# Patient Record
Sex: Male | Born: 1990 | Race: White | Hispanic: No | Marital: Single | Smoking: Never smoker
Health system: Southern US, Community
[De-identification: ages and names within clinical notes are randomized; demographics above are authoritative.]

## PROBLEM LIST (undated history)

## (undated) DIAGNOSIS — U071 COVID-19: Secondary | ICD-10-CM

## (undated) HISTORY — PX: EYE SURGERY: SHX253

---
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
## 2013-07-21 ENCOUNTER — Ambulatory Visit
Admission: RE | Admit: 2013-07-21 | Discharge: 2013-07-21 | Disposition: A | Discharge status: Home / Self Care | Payer: PRIVATE HEALTH INSURANCE | Source: Ambulatory Visit | Attending: Orthopedic Surgery | Admitting: Orthopedic Surgery

## 2013-07-21 ENCOUNTER — Other Ambulatory Visit: Payer: Self-pay | Admitting: Orthopedic Surgery

## 2013-07-21 DIAGNOSIS — M25562 Pain in left knee: Secondary | ICD-10-CM

## 2013-10-03 DIAGNOSIS — S82002D Unspecified fracture of left patella, subsequent encounter for closed fracture with routine healing: Secondary | ICD-10-CM | POA: Insufficient documentation

## 2014-07-31 DIAGNOSIS — R441 Visual hallucinations: Secondary | ICD-10-CM | POA: Insufficient documentation

## 2014-07-31 DIAGNOSIS — R440 Auditory hallucinations: Secondary | ICD-10-CM | POA: Insufficient documentation

## 2015-08-02 ENCOUNTER — Ambulatory Visit (INDEPENDENT_AMBULATORY_CARE_PROVIDER_SITE_OTHER): Payer: PRIVATE HEALTH INSURANCE

## 2015-08-02 ENCOUNTER — Ambulatory Visit (INDEPENDENT_AMBULATORY_CARE_PROVIDER_SITE_OTHER): Payer: PRIVATE HEALTH INSURANCE | Admitting: Family Medicine

## 2015-08-02 ENCOUNTER — Encounter: Payer: Self-pay | Admitting: Family Medicine

## 2015-08-02 VITALS — BP 126/81 | HR 88 | Temp 98.3°F | Wt 189.0 lb

## 2015-08-02 DIAGNOSIS — R0989 Other specified symptoms and signs involving the circulatory and respiratory systems: Secondary | ICD-10-CM | POA: Diagnosis not present

## 2015-08-02 DIAGNOSIS — R05 Cough: Secondary | ICD-10-CM

## 2015-08-02 DIAGNOSIS — R053 Chronic cough: Secondary | ICD-10-CM

## 2015-08-02 MED ORDER — GUAIFENESIN-CODEINE 100-10 MG/5ML PO SOLN: 5.0000 mL | Freq: Every evening | ORAL | Status: DC | PRN

## 2015-08-02 MED ORDER — BENZONATATE 200 MG PO CAPS: 200.0000 mg | ORAL_CAPSULE | Freq: Three times a day (TID) | ORAL | Status: DC | PRN

## 2015-08-02 MED ORDER — FLUTICASONE PROPIONATE 50 MCG/ACT NA SUSP: 2.0000 | Freq: Every day | NASAL | Status: DC

## 2015-08-02 MED ORDER — OMEPRAZOLE 40 MG PO CPDR: 40.0000 mg | DELAYED_RELEASE_CAPSULE | Freq: Every day | ORAL | Status: DC

## 2015-08-02 MED ORDER — ALBUTEROL SULFATE 108 (90 BASE) MCG/ACT IN AEPB: 1.0000 | INHALATION_SPRAY | RESPIRATORY_TRACT | Status: DC | PRN

## 2015-08-02 NOTE — Patient Instructions (Signed)
Thank you for coming in today. Use the albuterol inhaler as needed.  Take the tessalon cough pills during the day and use the codeine cough medicine at night as needed.  Get xray today.  Take omeprazole for possible acid reflux.  Take over the counter zyrtec (certizine) daily.  Use the nasal spray.   Call or go to the emergency room if you get worse, have trouble breathing, have chest pains, or palpitations.   Cough, Adult Coughing is a reflex that clears your throat and your airways. Coughing helps to heal and protect your lungs. It is normal to cough occasionally, but a cough that happens with other symptoms or lasts a long time may be a sign of a condition that needs treatment. A cough may last only 2-3 weeks (acute), or it may last longer than 8 weeks (chronic). CAUSES Coughing is commonly caused by:  Breathing in substances that irritate your lungs.  A viral or bacterial respiratory infection.  Allergies.  Asthma.  Postnasal drip.  Smoking.  Acid backing up from the stomach into the esophagus (gastroesophageal reflux).  Certain medicines.  Chronic lung problems, including COPD (or rarely, lung cancer).  Other medical conditions such as heart failure. HOME CARE INSTRUCTIONS  Pay attention to any changes in your symptoms. Take these actions to help with your discomfort:  Take medicines only as told by your health care provider.  If you were prescribed an antibiotic medicine, take it as told by your health care provider. Do not stop taking the antibiotic even if you start to feel better.  Talk with your health care provider before you take a cough suppressant medicine.  Drink enough fluid to keep your urine clear or pale yellow.  If the air is dry, use a cold steam vaporizer or humidifier in your bedroom or your home to help loosen secretions.  Avoid anything that causes you to cough at work or at home.  If your cough is worse at night, try sleeping in a  semi-upright position.  Avoid cigarette smoke. If you smoke, quit smoking. If you need help quitting, ask your health care provider.  Avoid caffeine.  Avoid alcohol.  Rest as needed. SEEK MEDICAL CARE IF:   You have new symptoms.  You cough up pus.  Your cough does not get better after 2-3 weeks, or your cough gets worse.  You cannot control your cough with suppressant medicines and you are losing sleep.  You develop pain that is getting worse or pain that is not controlled with pain medicines.  You have a fever.  You have unexplained weight loss.  You have night sweats. SEEK IMMEDIATE MEDICAL CARE IF:  You cough up blood.  You have difficulty breathing.  Your heartbeat is very fast.   This information is not intended to replace advice given to you by your health care provider. Make sure you discuss any questions you have with your health care provider.   Document Released: 10/17/2010 Document Revised: 01/09/2015 Document Reviewed: 06/27/2014 Elsevier Interactive Patient Education [employer]2016 Elsevier Inc.

## 2015-08-02 NOTE — Progress Notes (Signed)
       Christopher Zamora is a 25 y.o. male who [facility] today for establish care and discuss cough congestion and runny nose. Patient has headache and no lean nonproductive cough now for about a month. He notes over the last week has worsened. In the last week he has developed runny nose sneezing itchy watery eyes and some congestion. The cough is quite bothersome and interferes with sleep. He's tried Mucinex which has helped a little. He denies any significant wheezing or shortness of breath. No fevers chills vomiting or diarrhea.   History reviewed. No pertinent past medical history. Past Surgical History  Procedure Laterality Date  . Eye surgery     Social History  Substance Use Topics  . Smoking status: Never Smoker   . Smokeless tobacco: Not on file  . Alcohol Use: No   family history is not on file.  ROS as above: No headache, visual changes, nausea, vomiting, diarrhea, constipation, dizziness, abdominal pain, skin rash, fevers, chills, night sweats, weight loss, swollen lymph nodes, body aches, joint swelling, muscle aches, chest pain, shortness of breath, mood changes, visual or auditory hallucinations.   Medications: Current Outpatient Prescriptions  Medication Sig Dispense Refill  . Albuterol Sulfate (PROAIR RESPICLICK) 108 (90 Base) MCG/ACT AEPB Inhale 1-2 puffs into the lungs every 4 (four) hours as needed. 1 each 1  . benzonatate (TESSALON) 200 MG capsule Take 1 capsule (200 mg total) by mouth 3 (three) times daily as needed for cough. 45 capsule 3  . fluticasone (FLONASE) 50 MCG/ACT nasal spray Place 2 sprays into both nostrils daily. 16 g 2  . guaiFENesin-codeine 100-10 MG/5ML syrup Take 5 mLs by mouth at bedtime as needed for cough. 180 mL 0  . omeprazole (PRILOSEC) 40 MG capsule Take 1 capsule (40 mg total) by mouth daily. 30 capsule 3   No current  facility-administered medications for this visit.   Allergies  Allergen Reactions  . Pentobarbital Other (See Comments)    Violent behavior   . Erythromycin Rash     Exam:  BP 126/81 mmHg  Pulse 88  Temp(Src) 98.3 F (36.8 C) (Oral)  Wt 189 lb (85.73 kg)  SpO2 97% Gen: Well NAD Nontoxic appearing HEENT: EOMI,  MMM posterior pharynx with cobblestoning. Normal tympanic membranes left. Right is partially occluded by cerumen. No cervical lymphadenopathy. Lungs: Normal work of breathing. CTABL Heart: RRR no MRG Abd: NABS, Soft. Nondistended, Nontender Exts: Brisk capillary refill, warm and well perfused.   No results found for this or any previous visit (from the past 24 hour(s)). No results found.   25 year old male with chronic cough. Likely due to postnasal drainage due to seasonal allergies. Treat with codeine and Tessalon for cough suppression and treat with Zyrtec and Flonase. Additionally we'll provide albuterol in case this is a cough variant asthma and omeprazole in case this is reflux related. Additionally we'll obtain a chest x-ray..  Is not any improvement would consider treatment with prednisone and azithromycin for presumed bronchitis.

## 2015-08-05 ENCOUNTER — Telehealth: Payer: Self-pay

## 2015-08-05 MED ORDER — DOXYCYCLINE HYCLATE 100 MG PO TABS: 100.0000 mg | ORAL_TABLET | Freq: Two times a day (BID) | ORAL | Status: DC

## 2015-08-05 MED ORDER — PREDNISONE 10 MG PO TABS: 30.0000 mg | ORAL_TABLET | Freq: Every day | ORAL | Status: DC

## 2015-08-05 NOTE — Telephone Encounter (Signed)
Christopher Zamora complains of congestion and pain in the chest when he coughs. He wanted to know if he needs an antibiotic. Denies fever, chills or sweats.

## 2015-08-05 NOTE — Progress Notes (Signed)
Quick Note:  Xray shows bronchitis ______ 

## 2015-08-05 NOTE — Telephone Encounter (Signed)
Patient advised of recommendations.  

## 2015-08-05 NOTE — Telephone Encounter (Signed)
I sent in both Doxycycline antibiotic and prednisone.

## 2016-02-06 ENCOUNTER — Encounter: Payer: Self-pay | Admitting: *Deleted

## 2016-02-06 ENCOUNTER — Emergency Department (INDEPENDENT_AMBULATORY_CARE_PROVIDER_SITE_OTHER)
Admission: EM | Admit: 2016-02-06 | Discharge: 2016-02-06 | Disposition: A | Discharge status: Home / Self Care | Payer: PRIVATE HEALTH INSURANCE | Source: Home / Self Care | Attending: Family Medicine | Admitting: Family Medicine

## 2016-02-06 DIAGNOSIS — B9789 Other viral agents as the cause of diseases classified elsewhere: Secondary | ICD-10-CM

## 2016-02-06 DIAGNOSIS — J069 Acute upper respiratory infection, unspecified: Secondary | ICD-10-CM

## 2016-02-06 LAB — POCT RAPID STREP A (OFFICE): Rapid Strep A Screen: NEGATIVE

## 2016-02-06 MED ORDER — AMOXICILLIN 875 MG PO TABS: 875.0000 mg | ORAL_TABLET | Freq: Two times a day (BID) | ORAL | 0 refills | Status: DC

## 2016-02-06 MED ORDER — BENZONATATE 200 MG PO CAPS: 200.0000 mg | ORAL_CAPSULE | Freq: Every day | ORAL | 0 refills | Status: DC

## 2016-02-06 NOTE — ED Provider Notes (Signed)
[facility]    CSN: [id number] Arrival date & time: 02/06/16  1125     History   Chief Complaint Chief Complaint  Patient presents with  . Sore Throat  . Generalized Body Aches    in upper back    HPI Christopher Zamora is a 25 y.o. male.   Yesterday patient developed typical cold-like symptoms  including mild sore throat, sinus congestion, headache, fatigue, and cough.    The history is provided by the patient.    History reviewed. No pertinent past medical history.  There are no active problems to display for this patient.   Past Surgical History:  Procedure Laterality Date  . EYE SURGERY         Home Medications    Prior to Admission medications   Medication Sig Start Date End Date Taking? Authorizing Provider  Albuterol Sulfate (PROAIR RESPICLICK) 108 (90 Base) MCG/ACT AEPB Inhale 1-2 puffs into the lungs every 4 (four) hours as needed. 08/02/15   Rodolph BongEvan S Corey, MD  amoxicillin (AMOXIL) 875 MG tablet Take 1 tablet (875 mg total) by mouth 2 (two) times daily. (Rx void after 02/14/16) 02/06/16   Lattie HawStephen A Yuvaan Olander, MD  benzonatate (TESSALON) 200 MG capsule Take 1 capsule (200 mg total) by mouth at bedtime. Take as needed for cough 02/06/16   Lattie HawStephen A Hadlei Stitt, MD  fluticasone [facility](FLONASE) 50 MCG/ACT nasal spray Place 2 sprays into both nostrils daily. 08/02/15   Rodolph BongEvan S Corey, MD  omeprazole (PRILOSEC) 40 MG capsule Take 1 capsule (40 mg total) by mouth daily. 08/02/15   Rodolph BongEvan S Corey, MD    Family History History reviewed. No pertinent family history.  Social History Social History  Substance Use Topics  . Smoking status: Never Smoker  . Smokeless tobacco: Never Used  . Alcohol use No     Allergies   Pentobarbital and Erythromycin   Review of Systems Review of Systems + sore throat + cough No pleuritic pain No wheezing + nasal congestion + post-nasal drainage No sinus pain/pressure No itchy/red eyes No earache No hemoptysis No SOB No  fever/chills No nausea No vomiting No abdominal pain No diarrhea No urinary symptoms No skin rash + fatigue + myalgias + headache Used OTC meds without relief   Physical Exam Triage Vital Signs ED Triage Vitals [02/06/16 1145]  Enc Vitals Group     BP 118/76     Pulse Rate 83     Resp 14     Temp 98.2 F (36.8 C)     Temp Source Oral     SpO2 98 %     Weight 196 lb (88.9 kg)     Height 6\' 4"  (1.93 m)     Head Circumference      Peak Flow      Pain Score      Pain Loc      Pain Edu?      Excl. in GC?    No data found.   Updated Vital Signs BP 118/76 (BP Location: Left Arm)   Pulse 83   Temp 98.2 F (36.8 C) (Oral)   Resp 14   Ht 6\' 4"  (1.93 m)   Wt 196 lb (88.9 kg)   SpO2 98%   BMI 23.86 kg/m   Visual Acuity Right Eye Distance:   Left Eye Distance:   Bilateral Distance:    Right Eye Near:   Left Eye Near:    Bilateral Near:     Physical Exam  Nursing notes and Vital Signs reviewed. Appearance:  Patient appears stated age, and in no acute distress Eyes:  Pupils are equal, round, and reactive to light and accomodation.  Extraocular movement is intact.  Conjunctivae are not inflamed  Ears:  Right canal occluded with cerumen.  Left canal and tympanic membrane normal.  Nose:  Mildly congested turbinates.  No sinus tenderness.    Pharynx:  Normal Neck:  Supple.  Tender enlarged posterior/lateral nodes are palpated bilaterally  Lungs:  Clear to auscultation.  Breath sounds are equal.  Moving air well. Chest:  Distinct tenderness to palpation over the mid-sternum.  Heart:  Regular rate and rhythm without murmurs, rubs, or gallops.  Abdomen:  Nontender without masses or hepatosplenomegaly.  Bowel sounds are present.  No CVA or flank tenderness.  Extremities:  No edema.  Skin:  No rash present.    UC Treatments / Results  Labs (all labs ordered are listed, but only abnormal results are displayed) Labs Reviewed  POCT RAPID STREP A (OFFICE) negative     EKG  EKG Interpretation None       Radiology No results found.  Procedures Procedures (including critical care time)  Medications Ordered in UC Medications - No data to display   Initial Impression / Assessment and Plan / UC Course  I have reviewed the triage vital signs and the nursing notes.  Pertinent labs & imaging results that were available during my care of the patient were reviewed by me and considered in my medical decision making (see chart for details).  Clinical Course  There is no evidence of bacterial infection today.  Treat symptomatically for now  Prescription written for Benzonatate (Tessalon) to take at bedtime for night-time cough.  Take plain guaifenesin (1200mg  extended release tabs such as Mucinex) twice daily, with plenty of water, for cough and congestion.  May add Pseudoephedrine (30mg , one or two every 4 to 6 hours) for sinus congestion.  Get adequate rest.   May use Afrin nasal spray (or generic oxymetazoline) twice daily for about 5 days and then discontinue.  Also recommend using saline nasal spray several times daily and saline nasal irrigation (AYR is a common brand).  Use Flonase nasal spray each morning after using Afrin nasal spray and saline nasal irrigation. Try warm salt water gargles for sore throat.  Stop all antihistamines for now, and other non-prescription cough/cold preparations. May take Ibuprofen 200mg , 4 tabs every 8 hours with food for chest/sternum discomfort, body aches, sore throat, etc. Begin Amoxicillin if not improving about one week or if persistent fever develops (Given a prescription to hold, with an expiration date)  Follow-up with family doctor if not improving about10 days.      Final Clinical Impressions(s) / UC Diagnoses   Final diagnoses:  Viral URI with cough    New Prescriptions New Prescriptions   AMOXICILLIN (AMOXIL) 875 MG TABLET    Take 1 tablet (875 mg total) by mouth 2 (two) times daily. (Rx void  after 02/14/16)   BENZONATATE (TESSALON) 200 MG CAPSULE    Take 1 capsule (200 mg total) by mouth at bedtime. Take as needed for cough     Lattie Haw, MD 02/06/16 [phone]

## 2016-02-06 NOTE — Discharge Instructions (Signed)
Take plain guaifenesin (1200mg  extended release tabs such as Mucinex) twice daily, with plenty of water, for cough and congestion.  May add Pseudoephedrine (30mg , one or two every 4 to 6 hours) for sinus congestion.  Get adequate rest.   May use Afrin nasal spray (or generic oxymetazoline) twice daily for about 5 days and then discontinue.  Also recommend using saline nasal spray several times daily and saline nasal irrigation (AYR is a common brand).  Use Flonase nasal spray each morning after using Afrin nasal spray and saline nasal irrigation. Try warm salt water gargles for sore throat.  Stop all antihistamines for now, and other non-prescription cough/cold preparations. May take Ibuprofen 200mg , 4 tabs every 8 hours with food for chest/sternum discomfort, body aches, sore throat, etc. Begin Amoxicillin if not improving about one week or if persistent fever develops   Follow-up with family doctor if not improving about10 days.

## 2016-02-06 NOTE — ED Triage Notes (Signed)
Pt c/o sore throat x yesterday. Lat  Night developed aches in his upper back, fatigue and cough. Taken Mucinex. Afebrile.

## 2016-04-02 ENCOUNTER — Encounter: Payer: Self-pay | Admitting: Family Medicine

## 2016-04-02 ENCOUNTER — Ambulatory Visit (INDEPENDENT_AMBULATORY_CARE_PROVIDER_SITE_OTHER): Payer: PRIVATE HEALTH INSURANCE | Admitting: Family Medicine

## 2016-04-02 VITALS — BP 129/74 | HR 62 | Temp 98.6°F | Wt 201.0 lb

## 2016-04-02 DIAGNOSIS — H6592 Unspecified nonsuppurative otitis media, left ear: Secondary | ICD-10-CM

## 2016-04-02 MED ORDER — PREDNISONE 10 MG PO TABS: 30.0000 mg | ORAL_TABLET | Freq: Every day | ORAL | 0 refills | Status: DC

## 2016-04-02 MED ORDER — CEFDINIR 300 MG PO CAPS: 300.0000 mg | ORAL_CAPSULE | Freq: Two times a day (BID) | ORAL | 0 refills | Status: DC

## 2016-04-02 NOTE — Patient Instructions (Signed)
Thank you for coming in today.   Otitis Media, Adult Otitis media is redness, soreness, and puffiness (swelling) in the space just behind your eardrum (middle ear). It may be caused by allergies or infection. It often happens along with a cold. Follow these instructions at home:  Take your medicine as told. Finish it even if you start to feel better.  Only take over-the-counter or prescription medicines for pain, discomfort, or fever as told by your doctor.  Follow up with your doctor as told. Contact a doctor if:  You have otitis media only in one ear, or bleeding from your nose, or both.  You notice a lump on your neck.  You are not getting better in 3-5 days.  You feel worse instead of better. Get help right away if:  You have pain that is not helped with medicine.  You have puffiness, redness, or pain around your ear.  You get a stiff neck.  You cannot move part of your face (paralysis).  You notice that the bone behind your ear hurts when you touch it. This information is not intended to replace advice given to you by your health care provider. Make sure you discuss any questions you have with your health care provider. Document Released: 10/07/2007 Document Revised: 09/26/2015 Document Reviewed: 11/15/2012 Elsevier Interactive Patient Education  2017 [employer]

## 2016-04-02 NOTE — Progress Notes (Signed)
       Christopher Zamora is a 25 y.o. male who [facility] Sports Medicine today for left ear pain and pressure. Symptoms present for few days. Patient has decreased hearing. He also notes some mild right ear pain and pressure as well. The left symptoms are much worse than the right. He's tried some over-the-counter medicines which did not help much. No fevers chills vomiting or diarrhea.   No past medical history on file. Past Surgical History:  Procedure Laterality Date  . EYE SURGERY     Social History  Substance Use Topics  . Smoking status: Never Smoker  . Smokeless tobacco: Never Used  . Alcohol use No   family history is not on file.  ROS as above:  Medications: No current outpatient prescriptions on file.   No current facility-administered medications for this visit.    Allergies  Allergen Reactions  . Pentobarbital Other (See Comments)    Violent behavior   . Erythromycin Rash    Health Maintenance Health Maintenance  Topic Date Due  . HIV Screening  08/16/2006  . TETANUS/TDAP  08/16/2010  . INFLUENZA VACCINE  12/03/2015     Exam:  BP 129/74   Pulse 62   Temp 98.6 F (37 C)   Wt 201 lb (91.2 kg)   SpO2 100%   BMI 24.47 kg/m  Gen: Well NAD HEENT: EOMI,  MMM Left tympanic membrane with effusion without erythema. Nontender. Right tympanic membranes occluded by cerumen. Lungs: Normal work of breathing. CTABL Heart: RRR no MRG Abd: NABS, Soft. Nondistended, Nontender Exts: Brisk capillary refill, warm and well perfused.   Cerumen was irrigated from the right ear: Pt felt much better.    No results found for this or any previous visit (from the past 72 hour(s)). No results found.    Assessment and Plan: 25 y.o. male with Otitis media with effusion without infection and cerumen impaction. Treat with prednisone and Omnicef if not  better.   No orders of the defined types were placed in this encounter.   Discussed warning signs or symptoms. Please see discharge instructions. Patient expresses understanding.

## 2016-05-27 ENCOUNTER — Encounter: Payer: Self-pay | Admitting: Family Medicine

## 2016-05-27 ENCOUNTER — Ambulatory Visit (INDEPENDENT_AMBULATORY_CARE_PROVIDER_SITE_OTHER): Payer: PRIVATE HEALTH INSURANCE | Admitting: Family Medicine

## 2016-05-27 DIAGNOSIS — G8929 Other chronic pain: Secondary | ICD-10-CM

## 2016-05-27 DIAGNOSIS — R519 Headache, unspecified: Secondary | ICD-10-CM | POA: Insufficient documentation

## 2016-05-27 DIAGNOSIS — R51 Headache: Secondary | ICD-10-CM

## 2016-05-27 MED ORDER — TOPIRAMATE 50 MG PO TABS: ORAL_TABLET | ORAL | 3 refills | Status: DC

## 2016-05-27 NOTE — Patient Instructions (Signed)
Thank you for coming in today. Take tapomax daily.  Consider Krill Oil (Omega 3) over the counter daily.  You should hear form the neurologist in the near future about an appointment.  Recheck in a few months if not getting any better.
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
## 2016-06-16 ENCOUNTER — Telehealth: Payer: Self-pay | Admitting: Family Medicine

## 2016-06-16 NOTE — Telephone Encounter (Signed)
Left vm for pt to call us about whether he got flu shot or not.

## 2016-09-01 ENCOUNTER — Ambulatory Visit: Payer: Self-pay | Admitting: Neurology

## 2017-02-14 ENCOUNTER — Emergency Department (INDEPENDENT_AMBULATORY_CARE_PROVIDER_SITE_OTHER): Payer: PRIVATE HEALTH INSURANCE

## 2017-02-14 ENCOUNTER — Emergency Department (INDEPENDENT_AMBULATORY_CARE_PROVIDER_SITE_OTHER)
Admission: EM | Admit: 2017-02-14 | Discharge: 2017-02-14 | Disposition: A | Discharge status: Home / Self Care | Payer: PRIVATE HEALTH INSURANCE | Source: Home / Self Care | Attending: Emergency Medicine | Admitting: Emergency Medicine

## 2017-02-14 ENCOUNTER — Encounter: Payer: Self-pay | Admitting: Emergency Medicine

## 2017-02-14 DIAGNOSIS — S60311A Abrasion of right thumb, initial encounter: Secondary | ICD-10-CM | POA: Diagnosis not present

## 2017-02-14 DIAGNOSIS — S60011A Contusion of right thumb without damage to nail, initial encounter: Secondary | ICD-10-CM

## 2017-02-14 DIAGNOSIS — M79641 Pain in right hand: Secondary | ICD-10-CM

## 2017-02-14 DIAGNOSIS — S60221A Contusion of right hand, initial encounter: Secondary | ICD-10-CM | POA: Diagnosis not present

## 2017-02-14 MED ORDER — TETANUS-DIPHTH-ACELL PERTUSSIS 5-2.5-18.5 LF-MCG/0.5 IM SUSP
0.5000 mL | Freq: Once | INTRAMUSCULAR | Status: AC
  Administered 2017-02-14: 0.5 mL via INTRAMUSCULAR

## 2017-02-14 NOTE — ED Triage Notes (Addendum)
Patient presents to [facility] with a complaint of pain to thumb, states a metal base fell on his right thumb.

## 2017-02-14 NOTE — ED Provider Notes (Signed)
[facility]    CSN: [id number] Arrival date & time: 02/14/17  1117     History   Chief Complaint Chief Complaint  Patient presents with  . Hand Pain    HPI Dravyn Severs is a 26 y.o. male.   HPI Yesterday, while at church, a metal base accidentally fell on right hand. Sustained deep abrasion. No active bleeding or discharge currently. He is able to move his hand, but with increased pain. No fingernail injury. Currently, pain is sharp in right hand without radiation. 7 out of 10 intensity. He's not tried any particular treatment, other than cleaning the hand wound and dressing it. History reviewed. No pertinent past medical history.  Patient Active Problem List   Diagnosis Date Noted  . Chronic headache 05/27/2016  . 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 Hallucination, visual 07/31/2014  . Patella fracture, left, closed, with routine healing, subsequent encounter 10/03/2013    Past Surgical History:  Procedure Laterality Date  . EYE SURGERY         Home Medications    Prior to Admission medications   Medication Sig Start Date End Date Taking? Authorizing Provider  topiramate (TOPAMAX) 50 MG tablet One half tab by mouth daily for a week, then one tab by mouth daily. 05/27/16   Rodolph Bong, MD    Family History History reviewed. No pertinent family history.  Social History Social History  Substance Use Topics  . Smoking status: Never Smoker  . Smokeless tobacco: Never Used  . Alcohol use No     Allergies   Pentobarbital and Erythromycin   Review of Systems Review of Systems  All other systems reviewed and are negative.    Physical Exam Triage Vital Signs ED Triage Vitals  Enc Vitals Group     BP 02/14/17 1136 118/77     Pulse Rate 02/14/17 1136 74     Resp 02/14/17 1136 16     Temp 02/14/17 1136 98.2 F (36.8 C)     Temp Source 02/14/17 1136 Oral     SpO2 02/14/17 1136 96 %     Weight 02/14/17 1136 190 lb (86.2 kg)   Height 02/14/17 1136  (1.905 m)     Head Circumference --      Peak Flow --      Pain Score 02/14/17 1137 7     Pain Loc --      Pain Edu? --      Excl. in GC? --    No data found.   Updated Vital Signs BP 118/77 (BP Location: Left Arm)   Pulse 74   Temp 98.2 F (36.8 C) (Oral)   Resp 16   Ht  (1.905 m)   Wt 190 lb (86.2 kg)   SpO2 96%   BMI 23.75 kg/m   Visual Acuity Right Eye Distance:   Left Eye Distance:   Bilateral Distance:    Right Eye Near:   Left Eye Near:    Bilateral Near:     Physical Exam  Constitutional: He is oriented to person, place, and time. He appears well-developed and well-nourished. No distress.  HENT:  Head: Normocephalic and atraumatic.  Eyes: Pupils are equal, round, and reactive to light. No scleral icterus.  Neck: Normal range of motion. Neck supple.  Cardiovascular: Normal rate and regular rhythm.   Pulmonary/Chest: Effort normal.  Abdominal: He exhibits no distension.  Neurological: He is alert and oriented to person, place, and time. No cranial nerve deficit.  Skin: Skin is warm and dry.  Psychiatric: He has a normal mood and affect. His behavior is normal.  Vitals reviewed.  Right thumb and radial part of hand: Some deep abrasions and a few tiny epidermal avulsions, especially dorsum of right thumb. Tender, swollen, decreased range of motion. Tendons intact. No ligamentous instability. Neurovascular distally intact  UC Treatments / Results  Labs (all labs ordered are listed, but only abnormal results are displayed) Labs Reviewed - No data to display  EKG  EKG Interpretation None       Radiology Dg Hand Complete Right  Result Date: 02/14/2017 CLINICAL DATA:  Right hand pain after injury yesterday. EXAM: RIGHT HAND - COMPLETE 3+ VIEW COMPARISON:  None. FINDINGS: There is no evidence of fracture or dislocation. There is no evidence of arthropathy or other focal bone abnormality. Soft tissues are unremarkable.  IMPRESSION: Normal right hand. Electronically Signed   By: Lupita Raider, M.D.   On: 02/14/2017 11:57    Procedures Procedures (including critical care time)  Medications Ordered in UC Medications  Tdap (BOOSTRIX) injection 0.5 mL (0.5 mLs Intramuscular Given 02/14/17 1252)     Initial Impression / Assessment and Plan / UC Course  I have reviewed the triage vital signs and the nursing notes.  Pertinent labs & imaging results that were available during my care of the patient were reviewed by me and considered in my medical decision making (see chart for details).      Final Clinical Impressions(s) / UC Diagnoses   Final diagnoses:  Contusion of right hand, initial encounter  Contusion of right thumb without damage to nail, initial encounter  Abrasion of right thumb, initial encounter  For the abrasion right thumb/hand, cleansed with soap and water, antibacterial ointment and dressing today.  last tetanus shot is unknown, so DTaP given today. With his consent. He declined prescription pain med, but will use ibuprofen for pain. Instead of prescription ibuprofen, advised OTC ibuprofen, take 3 every 8 hours with food as needed for pain. Encourage rest, ice, compression with ACE bandage, and elevation of injured body part. Splint fashioned for right thumb including metacarpal aspect of right hand. Follow-up with sports medicine or orthopedics if no better one week, sooner if worse or new symptoms. Precautions discussed. Red flags discussed. Questions invited and answered. Patient voiced understanding and agreement.   Controlled Substance Prescriptions [place] Controlled Substance Registry consulted? Not Applicable   Lajean Manes, MD 02/14/17 2113

## 2017-05-05 ENCOUNTER — Telehealth: Payer: Self-pay | Admitting: Family Medicine

## 2017-05-05 MED ORDER — OSELTAMIVIR PHOSPHATE 75 MG PO CAPS: 75.0000 mg | ORAL_CAPSULE | Freq: Every day | ORAL | 0 refills | Status: DC

## 2017-05-05 NOTE — Telephone Encounter (Signed)
No labs, pending PCP approval.

## 2017-05-05 NOTE — Telephone Encounter (Signed)
Agree Tamiflu.  F/u PRN

## 2017-05-09 ENCOUNTER — Emergency Department (INDEPENDENT_AMBULATORY_CARE_PROVIDER_SITE_OTHER): Payer: PRIVATE HEALTH INSURANCE

## 2017-05-09 ENCOUNTER — Emergency Department (INDEPENDENT_AMBULATORY_CARE_PROVIDER_SITE_OTHER)
Admission: EM | Admit: 2017-05-09 | Discharge: 2017-05-09 | Disposition: A | Discharge status: Home / Self Care | Payer: PRIVATE HEALTH INSURANCE | Source: Home / Self Care | Attending: Family Medicine | Admitting: Family Medicine

## 2017-05-09 DIAGNOSIS — R69 Illness, unspecified: Secondary | ICD-10-CM | POA: Diagnosis not present

## 2017-05-09 DIAGNOSIS — R52 Pain, unspecified: Secondary | ICD-10-CM

## 2017-05-09 DIAGNOSIS — R05 Cough: Secondary | ICD-10-CM | POA: Diagnosis not present

## 2017-05-09 DIAGNOSIS — J111 Influenza due to unidentified influenza virus with other respiratory manifestations: Secondary | ICD-10-CM

## 2017-05-09 MED ORDER — IBUPROFEN 600 MG PO TABS
600.0000 mg | ORAL_TABLET | Freq: Once | ORAL | Status: AC
  Administered 2017-05-09: 600 mg via ORAL

## 2017-05-09 MED ORDER — GUAIFENESIN-CODEINE 100-10 MG/5ML PO SOLN: ORAL | 0 refills | Status: DC

## 2017-05-09 NOTE — Discharge Instructions (Signed)
Continue Tamiflu 75mg , one every 12 hours. Take plain guaifenesin (1200mg  extended release tabs such as Mucinex) twice daily, with plenty of water, for cough and congestion.  May add Pseudoephedrine (30mg , one or two every 4 to 6 hours) for sinus congestion.  Get adequate rest.   May use Afrin nasal spray (or generic oxymetazoline) each morning for about 5 days and then discontinue.  Also recommend using saline nasal spray several times daily and saline nasal irrigation (AYR is a common brand).   Try warm salt water gargles for sore throat.  Stop all antihistamines for now, and other non-prescription cough/cold preparations. May take Ibuprofen 200mg , 4 tabs every 8 hours with food for headache, body aches, fever, etc.

## 2017-05-09 NOTE — ED Triage Notes (Signed)
Pt c/o chills, body aches, cough, fatigue and headache since last night. Pt states a lot of people in his family has been diagnosed with the flu and he started tamiflu yesterday. Also had OTC advil last night.

## 2017-05-09 NOTE — ED Provider Notes (Signed)
[facility]    CSN: [id number] Arrival date & time: 05/09/17  1206     History   Chief Complaint Chief Complaint  Patient presents with  . Cough    HPI Christopher Zamora is a 27 y.o. male.   Patient developed a mild headache two days ago.  At 4am yesterday he developed myalgias, cough, and fever to 102+.  Several family members have the flu and he had been prescribed prophylactic Tamiflu which he started yesterday.  No pleuritic pain or shortness of breath. He has a past history of pneumonia several years ago.   The history is provided by the patient.    No past medical history on file.  Patient Active Problem List   Diagnosis Date Noted  . Chronic headache 05/27/2016  . 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 Hallucination, visual 07/31/2014  . Patella fracture, left, closed, with routine healing, subsequent encounter 10/03/2013    Past Surgical History:  Procedure Laterality Date  . EYE SURGERY         Home Medications    Prior to Admission medications   Medication Sig Start Date End Date Taking? Authorizing Provider  oseltamivir (TAMIFLU) 75 MG capsule Take 1 capsule (75 mg total) by mouth daily. 05/05/17  Yes Rodolph Bong, MD  guaiFENesin-codeine 100-10 MG/5ML syrup Take 10mL by mouth at bedtime as needed for cough.  May repeat dose in 4 to 6 hours. 05/09/17   Lattie Haw, MD  topiramate (TOPAMAX) 50 MG tablet One half tab by mouth daily for a week, then one tab by mouth daily. 05/27/16   Rodolph Bong, MD    Family History No family history on file.  Social History Social History   Tobacco Use  . Smoking status: Never Smoker  . Smokeless tobacco: Never Used  Substance Use Topics  . Alcohol use: No    Alcohol/week: 0.0 oz  . Drug use: No     Allergies   Pentobarbital and Erythromycin   Review of Systems Review of Systems No sore throat + cough No pleuritic pain No wheezing ? nasal congestion No post-nasal drainage No sinus  pain/pressure No itchy/red eyes No earache No hemoptysis No SOB + fever, + chills No nausea No vomiting No abdominal pain No diarrhea No urinary symptoms No skin rash + fatigue + myalgias + headache Used OTC meds without relief   Physical Exam Triage Vital Signs ED Triage Vitals  Enc Vitals Group     BP 05/09/17 1331 117/82     Pulse Rate 05/09/17 1331 (!) 107     Resp 05/09/17 1331 18     Temp 05/09/17 1331 (!) 101.2 F (38.4 C)     Temp Source 05/09/17 1331 Oral     SpO2 05/09/17 1331 96 %     Weight 05/09/17 1332 196 lb 12 oz (89.2 kg)     Height 05/09/17 1332 6\' 3"  (1.905 m)     Head Circumference --      Peak Flow --      Pain Score 05/09/17 1332 1     Pain Loc --      Pain Edu? --      Excl. in GC? --    No data found.  Updated Vital Signs BP 117/82 (BP Location: Right Arm)   Pulse (!) 107   Temp (!) 101.2 F (38.4 C) (Oral)   Resp 18   Ht 6\' 3"  (1.905 m)   Wt 196 lb 12 oz (89.2  kg)   SpO2 96%   BMI 24.59 kg/m   Visual Acuity Right Eye Distance:   Left Eye Distance:   Bilateral Distance:    Right Eye Near:   Left Eye Near:    Bilateral Near:     Physical Exam Nursing notes and Vital Signs reviewed. Appearance:  Patient appears stated age, and in no acute distress Eyes:  Pupils are equal, round, and reactive to light and accomodation.  Extraocular movement is intact.  Conjunctivae are not inflamed  Ears:  Canals normal.  Tympanic membranes normal.  Nose:  Mildly congested turbinates.  No sinus tenderness.  Pharynx:  Normal Neck:  Supple.  Enlarged posterior/lateral nodes are palpated bilaterally, tender to palpation on the left.   Lungs:  Clear to auscultation.  Breath sounds are equal.  Moving air well. Heart:  Regular rate and rhythm without murmurs, rubs, or gallops.  Abdomen:  Nontender without masses or hepatosplenomegaly.  Bowel sounds are present.  No CVA or flank tenderness.  Extremities:  No edema.  Skin:  No rash present.    UC  Treatments / Results  Labs (all labs ordered are listed, but only abnormal results are displayed) Labs Reviewed - No data to display  EKG  EKG Interpretation None       Radiology Dg Chest 2 View  Result Date: 05/09/2017 CLINICAL DATA:  Chills with body aches and cough. EXAM: CHEST  2 VIEW COMPARISON:  08/02/2015 FINDINGS: The lungs are clear without focal pneumonia, edema, pneumothorax or pleural effusion. The cardiopericardial silhouette is within normal limits for size. The visualized bony structures of the thorax are intact. IMPRESSION: No active cardiopulmonary disease. Electronically Signed   By: Kennith CenterEric  Mansell M.D.   On: 05/09/2017 14:42    Procedures Procedures (including critical care time)  Medications Ordered in UC Medications  ibuprofen (ADVIL,MOTRIN) tablet 600 mg (600 mg Oral Given 05/09/17 1325)     Initial Impression / Assessment and Plan / UC Course  I have reviewed the triage vital signs and the nursing notes.  Pertinent labs & imaging results that were available during my care of the patient were reviewed by me and considered in my medical decision making (see chart for details).    Rx for Robitussin AC for night time cough.  Controlled Substance Prescriptions I have consulted the [place] Controlled Substances Registry for this patient, and feel the risk/benefit ratio today is favorable for proceeding with this prescription for a controlled substance.   No evidence pneumonia. Continue Tamiflu 75mg , one every 12 hours. Take plain guaifenesin (1200mg  extended release tabs such as Mucinex) twice daily, with plenty of water, for cough and congestion.  May add Pseudoephedrine (30mg , one or two every 4 to 6 hours) for sinus congestion.  Get adequate rest.   May use Afrin nasal spray (or generic oxymetazoline) each morning for about 5 days and then discontinue.  Also recommend using saline nasal spray several times daily and saline nasal irrigation (AYR is a common brand).     Try warm salt water gargles for sore throat.  Stop all antihistamines for now, and other non-prescription cough/cold preparations. May take Ibuprofen 200mg , 4 tabs every 8 hours with food for headache, body aches, fever, etc. Followup with Family Doctor if not improved in 5 days.    Final Clinical Impressions(s) / UC Diagnoses   Final diagnoses:  Influenza-like illness    ED Discharge Orders        Ordered    guaiFENesin-codeine 100-10 MG/5ML  syrup     05/09/17 1504          Lattie Haw, MD 05/16/17 1027

## 2017-05-14 ENCOUNTER — Ambulatory Visit (INDEPENDENT_AMBULATORY_CARE_PROVIDER_SITE_OTHER): Payer: PRIVATE HEALTH INSURANCE

## 2017-05-14 ENCOUNTER — Encounter: Payer: Self-pay | Admitting: Family Medicine

## 2017-05-14 ENCOUNTER — Ambulatory Visit (INDEPENDENT_AMBULATORY_CARE_PROVIDER_SITE_OTHER): Payer: PRIVATE HEALTH INSURANCE | Admitting: Family Medicine

## 2017-05-14 VITALS — BP 132/78 | HR 83 | Temp 98.4°F | Ht 75.0 in | Wt 191.0 lb

## 2017-05-14 DIAGNOSIS — R059 Cough, unspecified: Secondary | ICD-10-CM

## 2017-05-14 DIAGNOSIS — R05 Cough: Secondary | ICD-10-CM

## 2017-05-14 DIAGNOSIS — R0602 Shortness of breath: Secondary | ICD-10-CM | POA: Diagnosis not present

## 2017-05-14 LAB — COMPLETE METABOLIC PANEL WITH GFR
AG RATIO: 1.6 (calc) (ref 1.0–2.5)
ALBUMIN MSPROF: 4.6 g/dL (ref 3.6–5.1)
ALKALINE PHOSPHATASE (APISO): 75 U/L (ref 40–115)
ALT: 17 U/L (ref 9–46)
AST: 15 U/L (ref 10–40)
BUN: 13 mg/dL (ref 7–25)
CO2: 28 mmol/L (ref 20–32)
Calcium: 9.6 mg/dL (ref 8.6–10.3)
Chloride: 100 mmol/L (ref 98–110)
Creat: 0.99 mg/dL (ref 0.60–1.35)
GFR, Est African American: 122 mL/min/{1.73_m2} (ref >=60)
GFR, Est Non African American: 105 mL/min/{1.73_m2} (ref >=60)
GLOBULIN: 2.8 g/dL (ref 1.9–3.7)
Glucose, Bld: 89 mg/dL (ref 65–99)
POTASSIUM: 4.2 mmol/L (ref 3.5–5.3)
SODIUM: 139 mmol/L (ref 135–146)
Total Bilirubin: 1 mg/dL (ref 0.2–1.2)
Total Protein: 7.4 g/dL (ref 6.1–8.1)

## 2017-05-14 LAB — CBC WITH DIFFERENTIAL/PLATELET
BASOS ABS: 44 {cells}/uL (ref 0–200)
Basophils Relative: 0.4 %
EOS ABS: 111 {cells}/uL (ref 15–500)
Eosinophils Relative: 1 %
HEMATOCRIT: 45.8 % (ref 38.5–50.0)
HEMOGLOBIN: 15.9 g/dL (ref 13.2–17.1)
LYMPHS ABS: 1610 {cells}/uL (ref 850–3900)
MCH: 29.2 pg (ref 27.0–33.0)
MCHC: 34.7 g/dL (ref 32.0–36.0)
MCV: 84 fL (ref 80.0–100.0)
MPV: 9.8 fL (ref 7.5–12.5)
Monocytes Relative: 8.5 %
NEUTROS PCT: 75.6 %
Neutro Abs: 8392 cells/uL — ABNORMAL HIGH (ref 1500–7800)
Platelets: 317 10*3/uL (ref 140–400)
RBC: 5.45 10*6/uL (ref 4.20–5.80)
RDW: 12 % (ref 11.0–15.0)
Total Lymphocyte: 14.5 %
WBC mixed population: 944 cells/uL (ref 200–950)
WBC: 11.1 10*3/uL — ABNORMAL HIGH (ref 3.8–10.8)

## 2017-05-14 MED ORDER — HYDROCODONE-HOMATROPINE 5-1.5 MG/5ML PO SYRP: 5.0000 mL | ORAL_SOLUTION | Freq: Four times a day (QID) | ORAL | 0 refills | Status: DC | PRN

## 2017-05-14 MED ORDER — BENZONATATE 200 MG PO CAPS: 200.0000 mg | ORAL_CAPSULE | Freq: Three times a day (TID) | ORAL | 1 refills | Status: DC | PRN

## 2017-05-14 MED ORDER — PREDNISONE 10 MG PO TABS: 30.0000 mg | ORAL_TABLET | Freq: Every day | ORAL | 0 refills | Status: DC

## 2017-05-14 NOTE — Patient Instructions (Addendum)
Thank you for coming in today. Get labs and xray.  Use hycodan cough medicine every 6 hours as needed.  Use tessalon during the day mostly.  Use OTC Zaditor drops (ketofen drops) Fill and take prednisone if not better and no pneumonia seen.  Call or go to the emergency room if you get worse, have trouble breathing, have chest pains, or palpitations.    Acute Bronchitis, Adult Acute bronchitis is when air tubes (bronchi) in the lungs suddenly get swollen. The condition can make it hard to breathe. It can also cause these symptoms:  A cough.  Coughing up clear, yellow, or green mucus.  Wheezing.  Chest congestion.  Shortness of breath.  A fever.  Body aches.  Chills.  A sore throat.  Follow these instructions at home: Medicines  Take over-the-counter and prescription medicines only as told by your doctor.  If you were prescribed an antibiotic medicine, take it as told by your doctor. Do not stop taking the antibiotic even if you start to feel better. General instructions  Rest.  Drink enough fluids to keep your pee (urine) clear or pale yellow.  Avoid smoking and secondhand smoke. If you smoke and you need help quitting, ask your doctor. Quitting will help your lungs heal faster.  Use an inhaler, cool mist vaporizer, or humidifier as told by your doctor.  Keep all follow-up visits as told by your doctor. This is important. How is this prevented? To lower your risk of getting this condition again:  Wash your hands often with soap and water. If you cannot use soap and water, use hand sanitizer.  Avoid contact with people who have cold symptoms.  Try not to touch your hands to your mouth, nose, or eyes.  Make sure to get the flu shot every year.  Contact a doctor if:  Your symptoms do not get better in 2 weeks. Get help right away if:  You cough up blood.  You have chest pain.  You have very bad shortness of breath.  You become dehydrated.  You faint  (pass out) or keep feeling like you are going to pass out.  You keep throwing up (vomiting).  You have a very bad headache.  Your fever or chills gets worse. This information is not intended to replace advice given to you by your health care provider. Make sure you discuss any questions you have with your health care provider. Document Released: 10/07/2007 Document Revised: 11/27/2015 Document Reviewed: 10/09/2015 Elsevier Interactive Patient Education  [employer]2018 [employer]

## 2017-05-14 NOTE — Progress Notes (Signed)
Christopher Zamora is a 27 y.o. male who [facility] today for severe cough.  Christopher Zamora was seen previously this week in urgent care for fevers and cough.  X-rays at that time were unremarkable and he was prescribed codeine cough syrup.  This is been only mildly helpful.  He notes an almost continuous cough is very bothersome at night and interfering with sleep.  He was exposed to flu about 10 days ago and received Tamiflu prophylaxis which he is just finishing up with now.  He does also note some mild  eye redness and discharge but denies any blurry vision or pain.  He denies any severe shortness of breath or chest pain.   No past medical history on file. Past Surgical History:  Procedure Laterality Date  . EYE SURGERY     Social History   Tobacco Use  . Smoking status: Never Smoker  . Smokeless tobacco: Never Used  Substance Use Topics  . Alcohol use: No    Alcohol/week: 0.0 oz   family history is not on file.  ROS as above:  Medications: Current Outpatient Medications  Medication Sig Dispense Refill  . topiramate (TOPAMAX) 50 MG tablet One half tab by mouth daily for a week, then one tab by mouth daily. 30 tablet 3  . benzonatate (TESSALON) 200 MG capsule Take 1 capsule (200 mg total) by mouth 3 (three) times daily as needed for cough. 45 capsule 1  . HYDROcodone-homatropine (HYCODAN) 5-1.5 MG/5ML syrup Take 5 mLs by mouth every 6 (six) hours as needed for cough. 240 mL 0  . predniSONE (DELTASONE) 10 MG tablet Take 3 tablets (30 mg total) by mouth daily with breakfast. 15 tablet 0   No current facility-administered medications for this visit.    Allergies  Allergen Reactions  . Pentobarbital Other (See Comments)    Violent behavior   . Erythromycin Rash    Health Maintenance Health Maintenance  Topic Date Due  . HIV Screening  08/16/2006  .  INFLUENZA VACCINE  12/02/2016  . TETANUS/TDAP  02/15/2027     Exam:  BP 132/78   Pulse 83   Temp 98.4 F (36.9 C) (Oral)   Ht 6\' 3"  (1.905 m)   Wt 191 lb (86.6 kg)   BMI 23.87 kg/m  Gen: Well NAD HEENT: EOMI,  MMM mild conjunctival injection without discharge.  Clear nasal discharge.  Normal Tympanic membranes bilaterally. Lungs: Normal work of breathing. CTABL frequent coughing Heart: RRR no MRG Abd: NABS, Soft. Nondistended, Nontender Exts: Brisk capillary refill, warm and well perfused.     Dg Chest 2 View  Result Date: 05/14/2017 CLINICAL DATA:  Cough, shortness of breath and congestion for the past 7 days. Recent diagnosis of the flu EXAM: CHEST  2 VIEW COMPARISON:  05/09/2017; 08/02/2015 FINDINGS: Grossly unchanged cardiac silhouette and mediastinal contours. No focal airspace opacities. No pleural effusion or pneumothorax. No evidence of edema. No acute osseous abnormalities. Note is made of bilateral cervical ribs. IMPRESSION: No acute cardiopulmonary disease. Specifically, no evidence of pneumonia. Electronically Signed   By: Simonne ComeJohn  Watts M.D.   On: 05/14/2017 14:14      Assessment and Plan: 27 y.o. male with severe cough.  Likely sequelae of virus.  Will check basic labs and repeat x-ray to rule out pneumonia or pneumothorax or some other serious allergy.  Will treat symptomatically with Hycodan cough syrup Tessalon Perles and use prednisone as a backup  if not better.   Orders Placed This Encounter  Procedures  . DG Chest 2 View    Order Specific Question:   Reason for exam:    Answer:   Cough, assess intra-thoracic pathology    Order Specific Question:   Preferred imaging location?    Answer:   [facility]  . CBC with Differential/Platelet  . COMPLETE METABOLIC PANEL WITH GFR   Meds ordered this encounter  Medications  . HYDROcodone-homatropine (HYCODAN) 5-1.5 MG/5ML syrup    Sig: Take 5 mLs by mouth every 6 (six) hours as needed for cough.     Dispense:  240 mL    Refill:  0    Pt failed codeine cough medicine  . benzonatate (TESSALON) 200 MG capsule    Sig: Take 1 capsule (200 mg total) by mouth 3 (three) times daily as needed for cough.    Dispense:  45 capsule    Refill:  1  . predniSONE (DELTASONE) 10 MG tablet    Sig: Take 3 tablets (30 mg total) by mouth daily with breakfast.    Dispense:  15 tablet    Refill:  0     Discussed warning signs or symptoms. Please see discharge instructions. Patient expresses understanding.

## 2019-03-21 ENCOUNTER — Emergency Department (INDEPENDENT_AMBULATORY_CARE_PROVIDER_SITE_OTHER)
Admission: EM | Admit: 2019-03-21 | Discharge: 2019-03-21 | Disposition: A | Discharge status: Home / Self Care | Payer: BC Managed Care – PPO | Source: Home / Self Care

## 2019-03-21 ENCOUNTER — Other Ambulatory Visit: Payer: Self-pay

## 2019-03-21 DIAGNOSIS — Z20822 Contact with and (suspected) exposure to covid-19: Secondary | ICD-10-CM

## 2019-03-21 DIAGNOSIS — Z20828 Contact with and (suspected) exposure to other viral communicable diseases: Secondary | ICD-10-CM | POA: Diagnosis not present

## 2019-03-21 NOTE — ED Triage Notes (Addendum)
Pt here today for COVID test. Says brother was exposed to someone at his school. Pts work is requesting he get a COVID test. Pt currently asymptomatic.

## 2019-03-21 NOTE — ED Provider Notes (Signed)
[facility]    CSN: [id number] Arrival date & time: 03/21/19  1251      History   Chief Complaint Chief Complaint  Patient presents with  . COVID testing    HPI Christopher Zamora is a 28 y.o. male.   28 year old male, with history of headaches, presenting today for Covid testing.  Patient states that he has been exposed to his brother and the brother was exposed last Wednesday.  Brother has been asymptomatic.  Patient states that his job wanted him to be tested which is why he is here today.  He said no cough, congestion, fever, chills, shortness of breath.  The history is provided by the patient.    History reviewed. No pertinent past medical history.  Patient Active Problem List   Diagnosis Date Noted  . Chronic headache 05/27/2016  . 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 Hallucination, visual 07/31/2014  . Patella fracture, left, closed, with routine healing, subsequent encounter 10/03/2013    Past Surgical History:  Procedure Laterality Date  . EYE SURGERY         Home Medications    Prior to Admission medications   Medication Sig Start Date End Date Taking? Authorizing Provider  benzonatate (TESSALON) 200 MG capsule Take 1 capsule (200 mg total) by mouth 3 (three) times daily as needed for cough. 05/14/17   Gregor Hams, MD  HYDROcodone-homatropine [facility]) 5-1.5 MG/5ML syrup Take 5 mLs by mouth every 6 (six) hours as needed for cough. 05/14/17   Gregor Hams, MD  predniSONE (DELTASONE) 10 MG tablet Take 3 tablets (30 mg total) by mouth daily with breakfast. 05/14/17   Gregor Hams, MD  topiramate (TOPAMAX) 50 MG tablet One half tab by mouth daily for a week, then one tab by mouth daily. 05/27/16   Gregor Hams, MD    Family History History reviewed. No pertinent family history.  Social History Social History   Tobacco Use  . Smoking status: Never Smoker  . Smokeless tobacco: Never Used  Substance Use Topics  . Alcohol use: No   Alcohol/week: 0.0 standard drinks  . Drug use: No     Allergies   Pentobarbital and Erythromycin   Review of Systems Review of Systems  Constitutional: Negative for chills and fever.  HENT: Negative for ear pain and sore throat.   Eyes: Negative for pain and visual disturbance.  Respiratory: Negative for cough and shortness of breath.   Cardiovascular: Negative for chest pain and palpitations.  Gastrointestinal: Negative for abdominal pain and vomiting.  Genitourinary: Negative for dysuria and hematuria.  Musculoskeletal: Negative for arthralgias and back pain.  Skin: Negative for color change and rash.  Neurological: Negative for seizures and syncope.  All other systems reviewed and are negative.    Physical Exam Triage Vital Signs ED Triage Vitals  Enc Vitals Group     BP 03/21/19 1308 132/80     Pulse Rate 03/21/19 1308 93     Resp 03/21/19 1308 18     Temp 03/21/19 1308 98.5 F (36.9 C)     Temp Source 03/21/19 1308 Oral     SpO2 03/21/19 1308 97 %     Weight 03/21/19 1309 200 lb (90.7 kg)     Height 03/21/19 1309 6\' 3"  (1.905 m)     Head Circumference --      Peak Flow --      Pain Score 03/21/19 1309 0     Pain Loc --  Pain Edu? --      Excl. in GC? --    No data found.  Updated Vital Signs BP 132/80 (BP Location: Right Arm)   Pulse 93   Temp 98.5 F (36.9 C) (Oral)   Resp 18   Ht 6\' 3"  (1.905 m)   Wt 200 lb (90.7 kg)   SpO2 97%   BMI 25.00 kg/m   Visual Acuity Right Eye Distance:   Left Eye Distance:   Bilateral Distance:    Right Eye Near:   Left Eye Near:    Bilateral Near:     Physical Exam Vitals signs and nursing note reviewed.  Constitutional:      Appearance: He is well-developed.  HENT:     Head: Normocephalic and atraumatic.  Eyes:     Conjunctiva/sclera: Conjunctivae normal.  Neck:     Musculoskeletal: Neck supple.  Cardiovascular:     Rate and Rhythm: Normal rate and regular rhythm.     Heart sounds: No murmur.   Pulmonary:     Effort: Pulmonary effort is normal. No respiratory distress.     Breath sounds: Normal breath sounds.  Abdominal:     Palpations: Abdomen is soft.     Tenderness: There is no abdominal tenderness.  Skin:    General: Skin is warm and dry.  Neurological:     Mental Status: He is alert.      UC Treatments / Results  Labs (all labs ordered are listed, but only abnormal results are displayed) Labs Reviewed  SARS-COV-2 RNA,(COVID-19) QUALITATIVE NAAT    EKG   Radiology No results found.  Procedures Procedures (including critical care time)  Medications Ordered in UC Medications - No data to display  Initial Impression / Assessment and Plan / UC Course  I have reviewed the triage vital signs and the nursing notes.  Pertinent labs & imaging results that were available during my care of the patient were reviewed by me and considered in my medical decision making (see chart for details).     Patient here today for Covid testing to return to work.  Patient asymptomatic. Final Clinical Impressions(s) / UC Diagnoses   Final diagnoses:  Exposure to COVID-19 virus   Discharge Instructions   None    ED Prescriptions    None     PDMP not reviewed this encounter.   , Alecia Lemming 03/21/19 1318

## 2019-03-24 LAB — SARS-COV-2 RNA,(COVID-19) QUALITATIVE NAAT: SARS CoV2 RNA: NOT DETECTED

## 2020-04-11 ENCOUNTER — Telehealth (INDEPENDENT_AMBULATORY_CARE_PROVIDER_SITE_OTHER): Payer: Managed Care, Other (non HMO) | Admitting: Family Medicine

## 2020-04-11 ENCOUNTER — Encounter: Payer: Self-pay | Admitting: Family Medicine

## 2020-04-11 DIAGNOSIS — U071 COVID-19: Secondary | ICD-10-CM

## 2020-04-11 MED ORDER — BENZONATATE 200 MG PO CAPS: 200.0000 mg | ORAL_CAPSULE | Freq: Three times a day (TID) | ORAL | 0 refills | Status: DC | PRN

## 2020-04-11 NOTE — Assessment & Plan Note (Signed)
Patient positive for COVID-19.  He will complete minimum 10-day quarantine.  His symptoms do seem to be improving at this point.  Recommend continue to push fluids with rest.  He may return to work on 04/15/2020.  Note provided and sent through my chart.  He will contact the clinic if symptoms are worsening.

## 2020-04-11 NOTE — Progress Notes (Signed)
Symptoms started: 04/02/20 with a fever  Tested on 04/03/20 @ [facility]  Current sx:  Cough SOB No taste or smell Loss of appetite  Not taking any medication currently.

## 2020-04-11 NOTE — Progress Notes (Signed)
Christopher Zamora - 29 y.o. male MRN [id number]  Date of birth: 01-08-1991   This visit type was conducted due to national recommendations for restrictions regarding the COVID-19 Pandemic (e.g. social distancing).  This format is felt to be most appropriate for this patient at this time.  All issues noted in this document were discussed and addressed.  No physical exam was performed (except for noted visual exam findings with Video Visits).  I discussed the limitations of evaluation and management by telemedicine and the availability of in person appointments. The patient expressed understanding and agreed to proceed.  I connected with@ on 04/11/20 at  1:20 PM EST by a video enabled telemedicine application and verified that I am speaking with the correct person using two identifiers.  Present at visit: Everrett Coombe, DO Tiana Loft   Patient Location: Home [address] [facility] [place] [address]   Provider location:   [facility]  Chief Complaint  Patient presents with  . Covid Positive    HPI  Christopher Zamora is a 29 y.o. male who presents via audio/video conferencing for a telehealth visit today.  Patient reports that he tested positive for Covid on April 03, 2020.  He reports that symptoms started 04/02/2020.  Symptoms include cough, mild shortness of breath, decreased taste and smell and decreased appetite.  His symptoms of fatigue are improving.  He has not had fever in a few days.  He needs note to return to work after his quarantine period is over.       ROS:  A comprehensive ROS was completed and negative except as noted per HPI  History reviewed. No pertinent past medical history.  Past Surgical History:  Procedure Laterality Date  . EYE SURGERY      History reviewed. No pertinent family history.  Social History   Socioeconomic History  . Marital status: Single    Spouse name: Not on file  . Number of children: Not on file  . Years of education: Not on file  . Highest  education level: Not on file  Occupational History  . Not on file  Tobacco Use  . Smoking status: Never Smoker  . Smokeless tobacco: Never Used  Substance and Sexual Activity  . Alcohol use: No    Alcohol/week: 0.0 standard drinks  . Drug use: No  . Sexual activity: Not on file  Other Topics Concern  . Not on file  Social History Narrative  . Not on file   Social Determinants of Health   Financial Resource Strain: Not on file  Food Insecurity: Not on file  Transportation Needs: Not on file  Physical Activity: Not on file  Stress: Not on file  Social Connections: Not on file  Intimate Partner Violence: Not on file     Current Outpatient Medications:  .  benzonatate (TESSALON) 200 MG capsule, Take 1 capsule (200 mg total) by mouth 3 (three) times daily as needed for cough., Disp: 30 capsule, Rfl: 0  EXAM:  VITALS per patient if applicable: Temp (!) 96.3 F (35.7 C) (Oral)   Wt 200 lb (90.7 kg)   SpO2 98%   BMI 25.00 kg/m   GENERAL: alert, oriented, appears well and in no acute distress  HEENT: atraumatic, conjunttiva clear, no obvious abnormalities on inspection of external nose and ears  NECK: normal movements of the head and neck  LUNGS: on inspection no signs of respiratory distress, breathing rate appears normal, no obvious gross SOB, gasping or wheezing  CV: no obvious cyanosis  MS: moves all visible extremities without noticeable abnormality  PSYCH/NEURO: pleasant and cooperative, no obvious depression or anxiety, speech and thought processing grossly intact  ASSESSMENT AND PLAN:  Discussed the following assessment and plan:  COVID-19 Patient positive for COVID-19.  He will complete minimum 10-day quarantine.  His symptoms do seem to be improving at this point.  Recommend continue to push fluids with rest.  He may return to work on 04/15/2020.  Note provided and sent through my chart.  He will contact the clinic if symptoms are worsening.     I  discussed the assessment and treatment plan with the patient. The patient was provided an opportunity to ask questions and all were answered. The patient agreed with the plan and demonstrated an understanding of the instructions.   The patient was advised to call back or seek an in-person evaluation if the symptoms worsen or if the condition fails to improve as anticipated.    Everrett Coombe, DO

## 2020-04-26 ENCOUNTER — Other Ambulatory Visit: Payer: Self-pay

## 2020-04-26 ENCOUNTER — Emergency Department (HOSPITAL_BASED_OUTPATIENT_CLINIC_OR_DEPARTMENT_OTHER): Payer: Managed Care, Other (non HMO)

## 2020-04-26 ENCOUNTER — Emergency Department (HOSPITAL_BASED_OUTPATIENT_CLINIC_OR_DEPARTMENT_OTHER)
Admission: EM | Admit: 2020-04-26 | Discharge: 2020-04-26 | Disposition: A | Discharge status: Home / Self Care | Payer: Managed Care, Other (non HMO) | Attending: Emergency Medicine | Admitting: Emergency Medicine

## 2020-04-26 ENCOUNTER — Encounter (HOSPITAL_BASED_OUTPATIENT_CLINIC_OR_DEPARTMENT_OTHER): Payer: Self-pay | Admitting: Emergency Medicine

## 2020-04-26 DIAGNOSIS — R197 Diarrhea, unspecified: Secondary | ICD-10-CM | POA: Diagnosis not present

## 2020-04-26 DIAGNOSIS — Z8616 Personal history of COVID-19: Secondary | ICD-10-CM | POA: Insufficient documentation

## 2020-04-26 DIAGNOSIS — R1110 Vomiting, unspecified: Secondary | ICD-10-CM | POA: Diagnosis not present

## 2020-04-26 DIAGNOSIS — R059 Cough, unspecified: Secondary | ICD-10-CM | POA: Diagnosis not present

## 2020-04-26 DIAGNOSIS — R000 Tachycardia, unspecified: Secondary | ICD-10-CM | POA: Diagnosis not present

## 2020-04-26 DIAGNOSIS — R109 Unspecified abdominal pain: Secondary | ICD-10-CM | POA: Insufficient documentation

## 2020-04-26 DIAGNOSIS — K529 Noninfective gastroenteritis and colitis, unspecified: Secondary | ICD-10-CM

## 2020-04-26 HISTORY — DX: COVID-19: U07.1

## 2020-04-26 LAB — COMPREHENSIVE METABOLIC PANEL
ALT: 13 U/L (ref 0–44)
AST: 15 U/L (ref 15–41)
Albumin: 4.5 g/dL (ref 3.5–5.0)
Alkaline Phosphatase: 59 U/L (ref 38–126)
Anion gap: 11 (ref 5–15)
BUN: 10 mg/dL (ref 6–20)
CO2: 25 mmol/L (ref 22–32)
Calcium: 9 mg/dL (ref 8.9–10.3)
Chloride: 102 mmol/L (ref 98–111)
Creatinine, Ser: 0.95 mg/dL (ref 0.61–1.24)
GFR, Estimated: >60 mL/min (ref >60)
Glucose, Bld: 99 mg/dL (ref 70–99)
Potassium: 3.4 mmol/L — ABNORMAL LOW (ref 3.5–5.1)
Sodium: 138 mmol/L (ref 135–145)
Total Bilirubin: 0.9 mg/dL (ref 0.3–1.2)
Total Protein: 7.3 g/dL (ref 6.5–8.1)

## 2020-04-26 LAB — LIPASE, BLOOD: Lipase: 29 U/L (ref 11–51)

## 2020-04-26 LAB — URINALYSIS, ROUTINE W REFLEX MICROSCOPIC
Bilirubin Urine: NEGATIVE
Glucose, UA: NEGATIVE mg/dL
Hgb urine dipstick: NEGATIVE
Ketones, ur: NEGATIVE mg/dL
Leukocytes,Ua: NEGATIVE
Nitrite: NEGATIVE
Protein, ur: NEGATIVE mg/dL
Specific Gravity, Urine: 1.01 (ref 1.005–1.030)
pH: 6.5 (ref 5.0–8.0)

## 2020-04-26 LAB — CBC WITH DIFFERENTIAL/PLATELET
Abs Immature Granulocytes: 0.01 10*3/uL (ref 0.00–0.07)
Basophils Absolute: 0 10*3/uL (ref 0.0–0.1)
Basophils Relative: 1 %
Eosinophils Absolute: 0.1 10*3/uL (ref 0.0–0.5)
Eosinophils Relative: 1 %
HCT: 41.7 % (ref 39.0–52.0)
Hemoglobin: 14.3 g/dL (ref 13.0–17.0)
Immature Granulocytes: 0 %
Lymphocytes Relative: 21 %
Lymphs Abs: 1.2 10*3/uL (ref 0.7–4.0)
MCH: 29.5 pg (ref 26.0–34.0)
MCHC: 34.3 g/dL (ref 30.0–36.0)
MCV: 86.2 fL (ref 80.0–100.0)
Monocytes Absolute: 0.6 10*3/uL (ref 0.1–1.0)
Monocytes Relative: 10 %
Neutro Abs: 3.9 10*3/uL (ref 1.7–7.7)
Neutrophils Relative %: 67 %
Platelets: 217 10*3/uL (ref 150–400)
RBC: 4.84 MIL/uL (ref 4.22–5.81)
RDW: 12 % (ref 11.5–15.5)
WBC: 5.8 10*3/uL (ref 4.0–10.5)
nRBC: 0 % (ref 0.0–0.2)

## 2020-04-26 MED ORDER — ONDANSETRON 8 MG PO TBDP: 8.0000 mg | ORAL_TABLET | Freq: Three times a day (TID) | ORAL | 0 refills | Status: DC | PRN

## 2020-04-26 MED ORDER — IOHEXOL 300 MG/ML  SOLN
100.0000 mL | Freq: Once | INTRAMUSCULAR | Status: AC
  Administered 2020-04-26: 22:00:00 100 mL via INTRAVENOUS

## 2020-04-26 MED ORDER — LOPERAMIDE HCL 2 MG PO CAPS
4.0000 mg | ORAL_CAPSULE | Freq: Once | ORAL | Status: AC
  Administered 2020-04-26: 23:00:00 4 mg via ORAL
  Filled 2020-04-26: qty 2

## 2020-04-26 MED ORDER — SODIUM CHLORIDE 0.9 % IV BOLUS
1000.0000 mL | Freq: Once | INTRAVENOUS | Status: AC
  Administered 2020-04-26: 22:00:00 1000 mL via INTRAVENOUS

## 2020-04-26 MED ORDER — POTASSIUM CHLORIDE CRYS ER 20 MEQ PO TBCR
40.0000 meq | EXTENDED_RELEASE_TABLET | Freq: Once | ORAL | Status: AC
  Administered 2020-04-26: 23:00:00 40 meq via ORAL
  Filled 2020-04-26: qty 2

## 2020-04-26 NOTE — ED Triage Notes (Signed)
Reports emesis x10, diarrhea yesterday, chills body aches onset tonight. Reports having covid on 12/1

## 2020-04-26 NOTE — ED Notes (Signed)
Brother nick would like a call when patient goes back to room [phone]

## 2020-04-26 NOTE — ED Provider Notes (Signed)
Nursing notes and vitals signs, including pulse oximetry, reviewed.  Summary of this visit's results, reviewed by myself:  EKG:  EKG Interpretation  Date/Time:    Ventricular Rate:    PR Interval:    QRS Duration:   QT Interval:    QTC Calculation:   R Axis:     Text Interpretation:         Labs:  Results for orders placed or performed during the hospital encounter of 04/26/20 (from the past 24 hour(s))  Comprehensive metabolic panel     Status: Abnormal   Collection Time: 04/26/20  9:25 PM  Result Value Ref Range   Sodium 138 135 - 145 mmol/L   Potassium 3.4 (L) 3.5 - 5.1 mmol/L   Chloride 102 98 - 111 mmol/L   CO2 25 22 - 32 mmol/L   Glucose, Bld 99 70 - 99 mg/dL   BUN 10 6 - 20 mg/dL   Creatinine, Ser 8.12 0.61 - 1.24 mg/dL   Calcium 9.0 8.9 - 75.1 mg/dL   Total Protein 7.3 6.5 - 8.1 g/dL   Albumin 4.5 3.5 - 5.0 g/dL   AST 15 15 - 41 U/L   ALT 13 0 - 44 U/L   Alkaline Phosphatase 59 38 - 126 U/L   Total Bilirubin 0.9 0.3 - 1.2 mg/dL   GFR, Estimated >70 >01 mL/min   Anion gap 11 5 - 15  CBC with Differential     Status: None   Collection Time: 04/26/20  9:25 PM  Result Value Ref Range   WBC 5.8 4.0 - 10.5 K/uL   RBC 4.84 4.22 - 5.81 MIL/uL   Hemoglobin 14.3 13.0 - 17.0 g/dL   HCT 74.9 44.9 - 67.5 %   MCV 86.2 80.0 - 100.0 fL   MCH 29.5 26.0 - 34.0 pg   MCHC 34.3 30.0 - 36.0 g/dL   RDW 91.6 38.4 - 66.5 %   Platelets 217 150 - 400 K/uL   nRBC 0.0 0.0 - 0.2 %   Neutrophils Relative % 67 %   Neutro Abs 3.9 1.7 - 7.7 K/uL   Lymphocytes Relative 21 %   Lymphs Abs 1.2 0.7 - 4.0 K/uL   Monocytes Relative 10 %   Monocytes Absolute 0.6 0.1 - 1.0 K/uL   Eosinophils Relative 1 %   Eosinophils Absolute 0.1 0.0 - 0.5 K/uL   Basophils Relative 1 %   Basophils Absolute 0.0 0.0 - 0.1 K/uL   Immature Granulocytes 0 %   Abs Immature Granulocytes 0.01 0.00 - 0.07 K/uL  Lipase, blood     Status: None   Collection Time: 04/26/20  9:25 PM  Result Value Ref Range    Lipase 29 11 - 51 U/L  Urinalysis, Routine w reflex microscopic Urine, Clean Catch     Status: None   Collection Time: 04/26/20  9:25 PM  Result Value Ref Range   Color, Urine YELLOW YELLOW   APPearance CLEAR CLEAR   Specific Gravity, Urine 1.010 1.005 - 1.030   pH 6.5 5.0 - 8.0   Glucose, UA NEGATIVE NEGATIVE mg/dL   Hgb urine dipstick NEGATIVE NEGATIVE   Bilirubin Urine NEGATIVE NEGATIVE   Ketones, ur NEGATIVE NEGATIVE mg/dL   Protein, ur NEGATIVE NEGATIVE mg/dL   Nitrite NEGATIVE NEGATIVE   Leukocytes,Ua NEGATIVE NEGATIVE    Imaging Studies: CT ABDOMEN PELVIS W CONTRAST  Result Date: 04/26/2020 CLINICAL DATA:  Right lower quadrant pain.  Appendicitis suspected. EXAM: CT ABDOMEN AND PELVIS WITH CONTRAST TECHNIQUE: Multidetector  CT imaging of the abdomen and pelvis was performed using the standard protocol following bolus administration of intravenous contrast. CONTRAST:  OMNIPAQUE IOHEXOL 300 MG/ML  SOLN COMPARISON:  None. FINDINGS: Lower chest: No acute abnormality. Hepatobiliary: No focal liver abnormality. No gallstones, gallbladder wall thickening, or pericholecystic fluid. No biliary dilatation. Pancreas: No focal lesion. Normal pancreatic contour. No surrounding inflammatory changes. No main pancreatic ductal dilatation. Spleen: Normal in size without focal abnormality. Adrenals/Urinary Tract: No adrenal nodule bilaterally. Bilateral kidneys enhance symmetrically. No hydronephrosis. No hydroureter. The urinary bladder is unremarkable. Stomach/Bowel: Stomach is within normal limits. No evidence of bowel wall thickening or dilatation. Appendix appears normal. Vascular/Lymphatic: No significant vascular findings are present. No enlarged abdominal or pelvic lymph nodes. Reproductive: Prostate is unremarkable. Other: No intraperitoneal free fluid. No intraperitoneal free gas. No organized fluid collection. Musculoskeletal: No acute or significant osseous findings. IMPRESSION: Normal  appendix. No acute intra-abdominal or intrapelvic abnormality. Electronically Signed   By: Tish Frederickson M.D.   On: 04/26/2020 22:54   DG Chest Portable 1 View  Result Date: 04/26/2020 CLINICAL DATA:  Cough, recent COVID-19 EXAM: PORTABLE CHEST 1 VIEW COMPARISON:  1 levin 19 FINDINGS: 2 frontal views of the chest demonstrate an unremarkable cardiac silhouette. No airspace disease, effusion, or pneumothorax. No acute bony abnormalities. IMPRESSION: 1. No acute intrathoracic process. Electronically Signed   By: Sharlet Salina M.D.   On: 04/26/2020 21:42   11:02 PM Patient advised of reassuring CT scan.  I suspect he has a viral gastroenteritis.  We will treat symptomatically with Zofran and over-the-counter antidiarrheals.   Paula Libra, MD 04/26/20 2303

## 2020-04-26 NOTE — ED Provider Notes (Signed)
[facility] EMERGENCY DEPARTMENT Provider Note   CSN: [id number] Arrival date & time: 04/26/20  1953     History Chief Complaint  Patient presents with  . covid symptoms    Christopher Zamora is a 29 y.o. male.  HPI 29 year old male presents with vomiting.  Patient had COVID starting on 12/1.  Seems to have recovered from that.  However since yesterday he had an episode of diarrhea and then today he has generally not felt well and vomited about 10 times.  Diffuse abdominal discomfort.  No obvious fever.  A little bit of a cough.  Felt like it hurt a little bit to urinate but he is also only urinated a couple times today.   Past Medical History:  Diagnosis Date  . COVID-19     Patient Active Problem List   Diagnosis Date Noted  . COVID-19 04/11/2020  . Chronic headache 05/27/2016  . 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 Hallucination, visual 07/31/2014  . Patella fracture, left, closed, with routine healing, subsequent encounter 10/03/2013    Past Surgical History:  Procedure Laterality Date  . EYE SURGERY         No family history on file.  Social History   Tobacco Use  . Smoking status: Never Smoker  . Smokeless tobacco: Never Used  Substance Use Topics  . Alcohol use: No    Alcohol/week: 0.0 standard drinks  . Drug use: No    Home Medications Prior to Admission medications   Medication Sig Start Date End Date Taking? Authorizing Provider  benzonatate (TESSALON) 200 MG capsule Take 1 capsule (200 mg total) by mouth 3 (three) times daily as needed for cough. 04/11/20   Everrett Coombe, DO    Allergies    Pentobarbital and Erythromycin  Review of Systems   Review of Systems  Constitutional: Negative for fever.  Respiratory: Positive for cough.   Gastrointestinal: Positive for abdominal pain, diarrhea and vomiting.  Genitourinary: Positive for dysuria.  Neurological: Positive for weakness and light-headedness.  All other systems reviewed and are  negative.   Physical Exam Updated Vital Signs BP (!) 130/9 (BP Location: Right Arm)   Pulse 96   Temp 98.9 F (37.2 C) (Oral)   Resp 18   Wt 79.7 kg   SpO2 100%   BMI 21.95 kg/m   Physical Exam Vitals and nursing note reviewed.  Constitutional:      General: He is not in acute distress.    Appearance: He is well-developed and well-nourished. He is not ill-appearing or diaphoretic.  HENT:     Head: Normocephalic and atraumatic.     Right Ear: External ear normal.     Left Ear: External ear normal.     Nose: Nose normal.  Eyes:     General:        Right eye: No discharge.        Left eye: No discharge.  Cardiovascular:     Rate and Rhythm: Regular rhythm. Tachycardia present.     Heart sounds: Normal heart sounds.  Pulmonary:     Effort: Pulmonary effort is normal.     Breath sounds: Normal breath sounds.  Abdominal:     Palpations: Abdomen is soft.     Tenderness: There is generalized abdominal tenderness.  Musculoskeletal:        General: No edema.     Cervical back: Neck supple.  Skin:    General: Skin is warm and dry.  Neurological:     Mental Status:  He is alert.  Psychiatric:        Mood and Affect: Mood is not anxious.     ED Results / Procedures / Treatments   Labs (all labs ordered are listed, but only abnormal results are displayed) Labs Reviewed  COMPREHENSIVE METABOLIC PANEL  CBC WITH DIFFERENTIAL/PLATELET  LIPASE, BLOOD  URINALYSIS, ROUTINE W REFLEX MICROSCOPIC    EKG None  Radiology No results found.  Procedures Procedures (including critical care time)  Medications Ordered in ED Medications  sodium chloride 0.9 % bolus 1,000 mL (has no administration in time range)    ED Course  I have reviewed the triage vital signs and the nursing notes.  Pertinent labs & imaging results that were available during my care of the patient were reviewed by me and considered in my medical decision making (see chart for details).    MDM  Rules/Calculators/A&P                          Patient with multiple episodes of vomiting and abdominal pain. Will get CT. Labs are reassuring. Care to Dr. Read Drivers.  Final Clinical Impression(s) / ED Diagnoses Final diagnoses:  None    Rx / DC Orders ED Discharge Orders    None       Pricilla Loveless, MD 04/26/20 2228

## 2020-05-09 ENCOUNTER — Other Ambulatory Visit: Payer: Self-pay

## 2020-05-09 ENCOUNTER — Encounter: Payer: Self-pay | Admitting: Medical-Surgical

## 2020-05-09 ENCOUNTER — Ambulatory Visit (INDEPENDENT_AMBULATORY_CARE_PROVIDER_SITE_OTHER): Payer: Managed Care, Other (non HMO) | Admitting: Medical-Surgical

## 2020-05-09 VITALS — BP 122/79 | HR 99 | Temp 98.4°F | Ht 75.0 in | Wt 177.7 lb

## 2020-05-09 DIAGNOSIS — K529 Noninfective gastroenteritis and colitis, unspecified: Secondary | ICD-10-CM

## 2020-05-09 NOTE — Progress Notes (Signed)
  Subjective:    CC: work clearance  HPI: Pleasant 30 year old male presenting today for work clearance after being seen at urgent care on 12/24 with gastroenteritis.  Notes that he had quite a bit of diarrhea with nausea, vomiting, and dry heaving.  The nausea/vomiting/dry heaving stopped at the end of last week and he is no longer having diarrhea.  Was using Zofran as needed but has not needed a dose of this since Friday or Saturday.  Denies fever, chills, abdominal pain.  Notes that bowel movements have returned to normal and he is able to eat and drink without difficulty.  I reviewed the past medical history, family history, social history, surgical history, and allergies today and no changes were needed.  Please see the problem list section below in epic for further details.  Past Medical History: Past Medical History:  Diagnosis Date  . COVID-19    Past Surgical History: Past Surgical History:  Procedure Laterality Date  . EYE SURGERY     Social History: Social History   Socioeconomic History  . Marital status: Single    Spouse name: Not on file  . Number of children: Not on file  . Years of education: Not on file  . Highest education level: Not on file  Occupational History  . Not on file  Tobacco Use  . Smoking status: Never Smoker  . Smokeless tobacco: Never Used  Substance and Sexual Activity  . Alcohol use: No    Alcohol/week: 0.0 standard drinks  . Drug use: No  . Sexual activity: Not on file  Other Topics Concern  . Not on file  Social History Narrative  . Not on file   Social Determinants of Health   Financial Resource Strain: Not on file  Food Insecurity: Not on file  Transportation Needs: Not on file  Physical Activity: Not on file  Stress: Not on file  Social Connections: Not on file   Family History: History reviewed. No pertinent family history. Allergies: Allergies  Allergen Reactions  . Pentobarbital Other (See Comments)    Violent  behavior   . Erythromycin Rash   Medications: See med rec.  Review of Systems: See HPI for pertinent positives and negatives.   Objective:    General: Well Developed, well nourished, and in no acute distress.  Neuro: Alert and oriented x3. HEENT: Normocephalic, atraumatic.   Skin: Warm and dry. Cardiac: Regular rate and rhythm, no murmurs rubs or gallops, no lower extremity edema.  Respiratory: Clear to auscultation bilaterally. Not using accessory muscles, speaking in full sentences. Abdomen: Soft, mild diffuse tenderness, nondistended. Bowel sounds + x 4 quadrants. No HSM appreciated.  Impression and Recommendations:    1. Gastroenteritis Resolved.  Okay to return to work.  Work note provided in both paper copy and sent to [employer]  Return if symptoms worsen or fail to improve. ___________________________________________ Thayer Ohm, DNP, APRN, FNP-BC Primary Care and Sports Medicine [facility] [place]

## 2021-02-04 ENCOUNTER — Emergency Department (HOSPITAL_COMMUNITY)
Admission: EM | Admit: 2021-02-04 | Discharge: 2021-02-05 | Disposition: A | Discharge status: Other Acute Inpatient Hospital | Payer: 59 | Attending: Emergency Medicine | Admitting: Emergency Medicine

## 2021-02-04 ENCOUNTER — Emergency Department (HOSPITAL_COMMUNITY): Payer: 59

## 2021-02-04 DIAGNOSIS — R45851 Suicidal ideations: Secondary | ICD-10-CM | POA: Diagnosis not present

## 2021-02-04 DIAGNOSIS — Z20822 Contact with and (suspected) exposure to covid-19: Secondary | ICD-10-CM | POA: Insufficient documentation

## 2021-02-04 DIAGNOSIS — F29 Unspecified psychosis not due to a substance or known physiological condition: Secondary | ICD-10-CM | POA: Diagnosis not present

## 2021-02-04 DIAGNOSIS — Z046 Encounter for general psychiatric examination, requested by authority: Secondary | ICD-10-CM | POA: Diagnosis present

## 2021-02-04 DIAGNOSIS — Z8616 Personal history of COVID-19: Secondary | ICD-10-CM | POA: Diagnosis not present

## 2021-02-04 LAB — CBC WITH DIFFERENTIAL/PLATELET
Abs Immature Granulocytes: 0.03 10*3/uL (ref 0.00–0.07)
Basophils Absolute: 0 10*3/uL (ref 0.0–0.1)
Basophils Relative: 0 %
Eosinophils Absolute: 0.1 10*3/uL (ref 0.0–0.5)
Eosinophils Relative: 1 %
HCT: 41.9 % (ref 39.0–52.0)
Hemoglobin: 14.4 g/dL (ref 13.0–17.0)
Immature Granulocytes: 0 %
Lymphocytes Relative: 22 %
Lymphs Abs: 1.7 10*3/uL (ref 0.7–4.0)
MCH: 29.8 pg (ref 26.0–34.0)
MCHC: 34.4 g/dL (ref 30.0–36.0)
MCV: 86.7 fL (ref 80.0–100.0)
Monocytes Absolute: 0.7 10*3/uL (ref 0.1–1.0)
Monocytes Relative: 9 %
Neutro Abs: 5.2 10*3/uL (ref 1.7–7.7)
Neutrophils Relative %: 68 %
Platelets: 185 10*3/uL (ref 150–400)
RBC: 4.83 MIL/uL (ref 4.22–5.81)
RDW: 11.8 % (ref 11.5–15.5)
WBC: 7.7 10*3/uL (ref 4.0–10.5)
nRBC: 0 % (ref 0.0–0.2)

## 2021-02-04 LAB — URINALYSIS, ROUTINE W REFLEX MICROSCOPIC
Bilirubin Urine: NEGATIVE
Glucose, UA: NEGATIVE mg/dL
Hgb urine dipstick: NEGATIVE
Ketones, ur: NEGATIVE mg/dL
Leukocytes,Ua: NEGATIVE
Nitrite: NEGATIVE
Protein, ur: NEGATIVE mg/dL
Specific Gravity, Urine: 1.026 (ref 1.005–1.030)
pH: 6 (ref 5.0–8.0)

## 2021-02-04 LAB — COMPREHENSIVE METABOLIC PANEL
ALT: 12 U/L (ref 0–44)
AST: 21 U/L (ref 15–41)
Albumin: 4.5 g/dL (ref 3.5–5.0)
Alkaline Phosphatase: 57 U/L (ref 38–126)
Anion gap: 7 (ref 5–15)
BUN: 18 mg/dL (ref 6–20)
CO2: 27 mmol/L (ref 22–32)
Calcium: 9.4 mg/dL (ref 8.9–10.3)
Chloride: 104 mmol/L (ref 98–111)
Creatinine, Ser: 0.92 mg/dL (ref 0.61–1.24)
GFR, Estimated: >60 mL/min (ref >60)
Glucose, Bld: 102 mg/dL — ABNORMAL HIGH (ref 70–99)
Potassium: 4.2 mmol/L (ref 3.5–5.1)
Sodium: 138 mmol/L (ref 135–145)
Total Bilirubin: 1.3 mg/dL — ABNORMAL HIGH (ref 0.3–1.2)
Total Protein: 7.2 g/dL (ref 6.5–8.1)

## 2021-02-04 LAB — RAPID URINE DRUG SCREEN, HOSP PERFORMED
Amphetamines: NOT DETECTED
Barbiturates: NOT DETECTED
Benzodiazepines: POSITIVE — AB
Cocaine: NOT DETECTED
Opiates: NOT DETECTED
Tetrahydrocannabinol: NOT DETECTED

## 2021-02-04 LAB — RESP PANEL BY RT-PCR (FLU A&B, COVID) ARPGX2
Influenza A by PCR: NEGATIVE
Influenza B by PCR: NEGATIVE
SARS Coronavirus 2 by RT PCR: NEGATIVE

## 2021-02-04 NOTE — ED Triage Notes (Signed)
Pt was in a hospital in [place] last night for behavioral episode.  Family brought him home to [facility] and took him to MD office for follow up today where he had another episode that was reportedly worse than last night.  Hitting himself in the head, combative.  EMS was called and gave 5 mg Haldol and 5 mg versed IM. Pt has 18G in LAC at this time.  Also recd 500 mL NS en route VSS

## 2021-02-04 NOTE — ED Notes (Signed)
Patients father : Nickoles Gregori [phone] Patients father stated that son is " allergic to Pentobarbital and when put to sleep he becomes very moody very quickly"

## 2021-02-04 NOTE — ED Triage Notes (Signed)
Pt's father, Adeoluwa Silvers, [phone] Mother, Keevon Henney, [phone]

## 2021-02-04 NOTE — ED Provider Notes (Signed)
[facility]-EMERGENCY DEPT Provider Note   CSN: [id number] Arrival date & time: 02/04/21  1808     History Chief Complaint  Patient presents with   Suicidal    Christopher Zamora is a 30 y.o. male. With a past medical history of psychiatric hospitalization and multiple previous concussions.  History is given by the patient's father via telephone. Patient was previously hospitalized in [place] during his college year after/suffering several concussions.  At that time he was having hallucinations and was hyperreligious.  Father reports that the patient recently obtained a job in [place].  They received a call yesterday from the patient.  He told his dad that he "did not feel well."  And said that he needed help.  Father also received a call from his boss who stated that he felt like his the patient needed to be seen by a doctor down here and stayed with the patient until his parents came. Father reports that he seems to have lost a lot of weight.  They took him to see his doctor today and the patient did not want to go because he said the doctor was "of the devil."  His father reports that the patient thinks that he is Zaveon from the Bible and that he is able to perform miracles.  He states that overnight the patient kept coming in his room talking about "off-the-wall things" like putting a shield around [place] to protect it. During the patient's doctor visit he began having a panic attack and apparently passed out.  Father states that when he woke up he was extremely agitated.  When they got him into the car the patient became violent and started trying to cut himself with a box cutter.  Father hopped in the back seat and try to remove it from his hand.  When when he could not access the box cutter he took a piece of rope and tried to tie it around his neck.  Father states that he had to hold him down until police and EMS arrival.  Patient was given 5 mg of IM Haldol prior  to arrival.  Patient was also trying to hit his head against the car doors. I placed this patient under involuntary commitment.  HPI     Past Medical History:  Diagnosis Date   COVID-19     Patient Active Problem List   Diagnosis Date Noted   COVID-19 04/11/2020   Chronic headache 05/27/2016   Auditory hallucination 07/31/2014   Hallucination, visual 07/31/2014   Patella fracture, left, closed, with routine healing, subsequent encounter 10/03/2013    Past Surgical History:  Procedure Laterality Date   EYE SURGERY         No family history on file.  Social History   Tobacco Use   Smoking status: Never   Smokeless tobacco: Never  Substance Use Topics   Alcohol use: No    Alcohol/week: 0.0 standard drinks   Drug use: No    Home Medications Prior to Admission medications   Not on File    Allergies    Pentobarbital and Erythromycin  Review of Systems   Review of Systems  Unable to perform ROS: Psychiatric disorder   Physical Exam Updated Vital Signs BP 135/82   Pulse 94   Temp 98.4 F (36.9 C)   Resp 16   SpO2 99%   Physical Exam Vitals and nursing note reviewed.  Constitutional:      General: He is not in acute  distress.    Appearance: He is well-developed. He is not diaphoretic.  HENT:     Head: Normocephalic and atraumatic.  Eyes:     General: No scleral icterus.    Conjunctiva/sclera: Conjunctivae normal.  Cardiovascular:     Rate and Rhythm: Normal rate and regular rhythm.     Heart sounds: Normal heart sounds.  Pulmonary:     Effort: Pulmonary effort is normal. No respiratory distress.     Breath sounds: Normal breath sounds.  Abdominal:     Palpations: Abdomen is soft.     Tenderness: There is no abdominal tenderness.  Musculoskeletal:     Cervical back: Normal range of motion and neck supple.  Skin:    General: Skin is warm and dry.  Neurological:     Mental Status: He is alert.  Psychiatric:        Attention and Perception:  He is inattentive.        Mood and Affect: Affect is flat.        Speech: Speech is delayed.        Behavior: Behavior is slowed.     Comments: Patient unable to give much history.  He states that he does not know why he is here he does not know what happened today.  He did not remember that he was in [place].  He is unable to give any history otherwise.    ED Results / Procedures / Treatments   Labs (all labs ordered are listed, but only abnormal results are displayed) Labs Reviewed  COMPREHENSIVE METABOLIC PANEL - Abnormal; Notable for the following components:      Result Value   Glucose, Bld 102 (*)    Total Bilirubin 1.3 (*)    All other components within normal limits  RAPID URINE DRUG SCREEN, HOSP PERFORMED - Abnormal; Notable for the following components:   Benzodiazepines POSITIVE (*)    All other components within normal limits  RESP PANEL BY RT-PCR (FLU A&B, COVID) ARPGX2  CBC WITH DIFFERENTIAL/PLATELET  URINALYSIS, ROUTINE W REFLEX MICROSCOPIC  ETHANOL  ACETAMINOPHEN LEVEL  SALICYLATE LEVEL    EKG None  Radiology CT HEAD WO CONTRAST ( )  Result Date: 02/04/2021 CLINICAL DATA:  Head trauma EXAM: CT HEAD WITHOUT CONTRAST TECHNIQUE: Contiguous axial images were obtained from the base of the skull through the vertex without intravenous contrast. COMPARISON:  CT head 07/03/2002 report without imaging FINDINGS: Brain: No evidence of large-territorial acute infarction. No parenchymal hemorrhage. No mass lesion. No extra-axial collection. No mass effect or midline shift. No hydrocephalus. Mega Cisterna magna again noted. Basilar cisterns are patent. Vascular: No hyperdense vessel. Skull: No acute fracture or focal lesion. Sinuses/Orbits: Paranasal sinuses and mastoid air cells are clear. The orbits are unremarkable. Other: None. IMPRESSION: No acute intracranial abnormality. Electronically Signed   By: Tish Frederickson M.D.   On: 02/04/2021 20:02    Procedures Procedures    Medications Ordered in ED Medications  QUEtiapine (SEROQUEL) tablet 25 mg (25 mg Oral Given 02/05/21 1301)    ED Course  I have reviewed the triage vital signs and the nursing notes.  Pertinent labs & imaging results that were available during my care of the patient were reviewed by me and considered in my medical decision making (see chart for details).    MDM Rules/Calculators/A&P                           Patient with psychosis and violent  behavior earlier. I placed the patient under IVC. Labs reviewed. CT head negative. Medically clear Final Clinical Impression(s) / ED Diagnoses Final diagnoses:  Suicidal ideation    Rx / DC Orders ED Discharge Orders     None        Arthor Captain, PA-C 02/05/21 1654    Milagros Loll, MD 02/06/21 1140

## 2021-02-04 NOTE — BH Assessment (Signed)
Comprehensive Clinical Assessment (CCA) Note  02/04/2021 Christopher Zamora [id number]  Discharge Disposition: Melbourne Abts, PA-C, reviewed pt's chart and information and determined pt meets inpatient criteria. Pt's referral information will be relayed to [facility] and to multiple hospitals for potential placement. This information was relayed to pt's team at 2214.  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 Acute risk factors for suicide include: social withdrawal/isolation and loss (financial, interpersonal, professional). Protective factors for this patient include: positive social support and hope for the future. Considering these factors, the overall suicide risk at this point appears to be high. Patient is not appropriate for outpatient follow up.  Therefore, a 1:1 sitter is recommended for suicide precautions at this time.  Flowsheet Row ED from 02/04/2021 in [facility]  C-SSRS RISK CATEGORY High Risk     Chief Complaint:  Chief Complaint  Patient presents with   Suicidal   Visit Diagnosis: F29, Unspecified psychosis not due to a substance or known physiological condition   CCA Screening, Triage and Referral (STR) Christopher Zamora is a 30 year old male who was brought to the [facility] by EMS after his father called 911 due to pt's lethargy, inability to follow directions, AMS, at attempting to kill himself. When asked why he was brought to the hospital tonight, pt states, "I don't really know. It's been a long day." Pt's father shares pt was picked up by his employer in [place] and was taken to the hospital. He states pt was in the hospital throughout yesterday and was picked up by him  and pt's mother and brought home. They took pt to see a PCP today and, while in the office, he began to have a panic attack, passed out, and was lying on the floor in the office. Pt was unable to walk but his family managed to get him out to pt's father's truck where he laid down on the floorboard. While on the floorboard, pt allegedly found a boxcutter and held it to his throat and, once it was taken away, found a rope and put it around his neck. Pt's father shares EMS was called and they brought pt to the hospital.  History was unable to be obtained by pt due to his AMS. Pt did state, "I've just been through so much pain - I have numbness on the sides of my head." Pt was able to identify his name, DOB, the names of his parents, and the current month and year. He was unable to provide his address, whom he lived with, where he currently was, his parent's phone numbers, or whom the president currently is.  Pt's father shares pt has had 4-5 concussions in his life, the most recent taking place while he was in college in [place] in 2015; pt's father states pt was hospitalized for 7 days after that concussion. His father shares pt has not been sleeping and has lost approx 25 lbs. He states pt was hyper-religious at his job in [place], trying to "save" people and telling them he was Bracken from the [place]. Pt has also presented with paranoia and thinking his games are after him.  Pt is oriented x3; he is unable to identify where he is or whom the president is. Pt's  recent and remote memory is impaired. Pt was cooperative, though confused, throughout the assessment process. Pt's insight, judgement, and impulse control is poor at this time.  Patient Reported Information How did you hear about [place]? Family/Friend  What Is the Reason for Your Visit/Call Today? Pt states, "I don't really know. It's been a long day." Pt's father shares pt was picked up by his employer in [place] and was taken to the hospital. He  states pt was in the hospital throughout yesterday and was picked up by him and pt's mother and brought home. They took pt to see a PCP today and, while in the office, he began to have a panic attack, passed out, and was lying on the floor in the office. Pt was unable to walk but his family managed to get him out to pt's father's truck where he laid down on the floorboard. While on the floorboard, pt allegedly found a boxcutter and help it to his throat and, once it was taken away, found a rope and put it around his neck. Pt's father shares EMS was called and they brought pt to the hospital.  How Long Has This Been Causing You Problems? 1 wk - 1 month  What Do You Feel Would Help You the Most Today? Treatment for Depression or other mood problem; Medication(s)   Have You Recently Had Any Thoughts About Hurting Yourself? -- (Pt's father confirms)  Are You Planning to Commit Suicide/Harm Yourself At This time? -- (Pt's father confirms)   Have you Recently Had Thoughts About Hurting Someone Karolee Ohs? No  Are You Planning to Harm Someone at This Time? No  Explanation: No data recorded  Have You Used Any Alcohol or Drugs in the Past 24 Hours? No  How Long Ago Did You Use Drugs or Alcohol? No data recorded What Did You Use and How Much? No data recorded  Do You Currently Have a Therapist/Psychiatrist? No  Name of Therapist/Psychiatrist: No data recorded  Have You Been Recently Discharged From Any Office Practice or Programs? No  Explanation of Discharge From Practice/Program: No data recorded    CCA Screening Triage Referral Assessment Type of Contact: Tele-Assessment  Telemedicine Service Delivery: Telemedicine service delivery: This service was provided via telemedicine using a 2-way, interactive audio and video technology  Is this Initial or Reassessment? Initial Assessment  Date Telepsych consult ordered in [facility]:  02/04/21  Time Telepsych consult ordered in [facility]:  1909  Location of  Assessment: [facility] ED  Provider Location: [facility] Assessment Services   Collateral Involvement: Jaxson Anglin, father: [phone]   Does Patient Have a Court Appointed Legal Guardian? No data recorded Name and Contact of Legal Guardian: No data recorded If Minor and Not Living with Parent(s), Who has Custody? N/A  Is CPS involved or ever been involved? Never  Is APS involved or ever been involved? Never   Patient Determined To Be At Risk for Harm To Self or Others Based on Review of Patient Reported Information or Presenting Complaint? Yes, for Self-Harm  Method: No data recorded Availability of Means: No data recorded Intent: No data recorded Notification Required: No data recorded Additional Information for Danger to Others Potential: No data recorded Additional Comments for Danger to Others Potential: No data recorded Are There Guns or Other Weapons in Your Home? No data recorded Types of Guns/Weapons: No data recorded Are These Weapons Safely Secured?  No data recorded Who Could Verify You Are Able To Have These Secured: No data recorded Do You Have any Outstanding Charges, Pending Court Dates, Parole/Probation? No data recorded Contacted To Inform of Risk of Harm To Self or Others: Family/Significant Other: (Pt's family is aware)    Does Patient Present under Involuntary Commitment? Yes  IVC Papers Initial File Date: 02/04/21   [place] of Residence: [place]   Patient Currently Receiving the Following Services: Not Receiving Services   Determination of Need: Emergent (2 hours)   Options For Referral: Inpatient Hospitalization; Medication Management; Outpatient Therapy     CCA Biopsychosocial Patient Reported Schizophrenia/Schizoaffective Diagnosis in Past: No   Strengths: Pt has maintained employment in the past. He moved to [place] and lived independently. He is able to identify he needs help for his current mental health  symptoms.   Mental Health Symptoms Depression:   Difficulty Concentrating; Sleep (too much or little); Increase/decrease in appetite; Weight gain/loss   Duration of Depressive symptoms:  Duration of Depressive Symptoms: Greater than two weeks   Mania:   None   Anxiety:    Worrying; Tension; Restlessness   Psychosis:   Delusions; Hallucinations   Duration of Psychotic symptoms:  Duration of Psychotic Symptoms: Less than six months   Trauma:   None   Obsessions:   None   Compulsions:   None   Inattention:   None   Hyperactivity/Impulsivity:   None   Oppositional/Defiant Behaviors:   None   Emotional Irregularity:   Recurrent suicidal behaviors/gestures/threats; Potentially harmful impulsivity; Transient, stress-related paranoia/disassociation   Other Mood/Personality Symptoms:   None noted    Mental Status Exam Appearance and self-care  Stature:   Average   Weight:   Average weight   Clothing:   -- (Pt is dressed in scrubs)   Grooming:   Normal   Cosmetic use:   None   Posture/gait:   Stooped   Motor activity:   Restless   Sensorium  Attention:   Confused   Concentration:   Scattered   Orientation:   Object; Person; Time   Recall/memory:   Defective in Immediate; Defective in Short-term; Defective in Recent   Affect and Mood  Affect:   Anxious   Mood:   Anxious   Relating  Eye contact:   Fleeting   Facial expression:   Anxious   Attitude toward examiner:   Cooperative   Thought and Language  Speech flow:  Slow   Thought content:   Appropriate to Mood and Circumstances   Preoccupation:   None   Hallucinations:   Other (Comment) (Pt denies; his father shares pt said he thought he was Usher from the Bible, has expressed paranoia, and stated he thought games were after him; did not share specific incidents of pt experiencing hallucinations)   Organization:  No data recorded  [employer] of  Knowledge:   Average   Intelligence:   Average   Abstraction:   Abstract   Judgement:   Impaired   Reality Testing:   Distorted   Insight:   Gaps   Decision Making:   Impulsive   Social Functioning  Social Maturity:   Impulsive   Social Judgement:   -- Industrial/product designer)   Stress  Stressors:   Housing; Work; Transitions   Coping Ability:   Deficient supports; Overwhelmed   Skill Deficits:   Activities of daily living; Decision making; Self-care; Self-control   Supports:   Family     Religion: Religion/Spirituality Are  You A Religious Person?:  (Not assessed) How Might This Affect Treatment?: Not assessed  Leisure/Recreation: Leisure / Recreation Do You Have Hobbies?: Yes Leisure and Hobbies: Pt plays basketball  Exercise/Diet: Exercise/Diet Do You Exercise?:  (Not assessed) Have You Gained or Lost A Significant Amount of Weight in the Past Six Months?: Yes-Lost Number of Pounds Lost?: 25 (Per pt's father) Do You Follow a Special Diet?: No Do You Have Any Trouble Sleeping?: Yes Explanation of Sleeping Difficulties: Pt's father shares pt has not been sleeping; was unable to provide detailed information regarding this   CCA Employment/Education Employment/Work Situation: Employment / Work Situation Employment Situation: Leave of absence Patient's Job has Been Impacted by Current Illness: Yes Describe how Patient's Job has Been Impacted: Pt told his parents he needed to move back home from [place] where he has been working Has Patient ever Been in Equities trader?:  (Not assessed)  Education: Education Is Patient Currently Attending School?: No Last Grade Completed:  (Not assessed) Did You Attend College?:  (UTA) Did You Have An Individualized Education Program (IIEP):  (UTA) Did You Have Any Difficulty At School?:  (UTA) Patient's Education Has Been Impacted by Current Illness:  (UTA)   CCA Family/Childhood History Family and Relationship  History: Family history Marital status: Single Does patient have children?: No  Childhood History:  Childhood History By whom was/is the patient raised?: Both parents Did patient suffer any verbal/emotional/physical/sexual abuse as a child?:  (UTA) Did patient suffer from severe childhood neglect?:  (UTA) Has patient ever been sexually abused/assaulted/raped as an adolescent or adult?:  (UTA) Was the patient ever a victim of a crime or a disaster?:  (UTA) Witnessed domestic violence?:  (UTA) Has patient been affected by domestic violence as an adult?:  Industrial/product designer)  Child/Adolescent Assessment:     CCA Substance Use Alcohol/Drug Use: Alcohol / Drug Use Pain Medications: See MAR Prescriptions: See MAR Over the Counter: See MAR History of alcohol / drug use?: No history of alcohol / drug abuse (Per pt's parents; UDA was clean) Longest period of sobriety (when/how long): N/A Negative Consequences of Use:  (N/A) Withdrawal Symptoms:  (N/A)                         ASAM's:  Six Dimensions of Multidimensional Assessment  Dimension 1:  Acute Intoxication and/or Withdrawal Potential:      Dimension 2:  Biomedical Conditions and Complications:      Dimension 3:  Emotional, Behavioral, or Cognitive Conditions and Complications:     Dimension 4:  Readiness to Change:     Dimension 5:  Relapse, Continued use, or Continued Problem Potential:     Dimension 6:  Recovery/Living Environment:     ASAM Severity Score:    ASAM Recommended Level of Treatment: ASAM Recommended Level of Treatment:  (N/A)   Substance use Disorder (SUD) Substance Use Disorder (SUD)  Checklist Symptoms of Substance Use:  (N/A)  Recommendations for Services/Supports/Treatments: Recommendations for Services/Supports/Treatments Recommendations For Services/Supports/Treatments: Individual Therapy, Inpatient Hospitalization, Medication Management  Discharge Disposition: Melbourne Abts, PA-C, reviewed pt's chart  and information and determined pt meets inpatient criteria. Pt's referral information will be relayed to [facility] and to multiple hospitals for potential placement. This information was relayed to pt's team at 2214.  DSM5 Diagnoses: Patient Active Problem List   Diagnosis Date Noted   COVID-19 04/11/2020   Chronic headache 05/27/2016   Auditory hallucination 07/31/2014   Hallucination, visual 07/31/2014   Patella  fracture, left, closed, with routine healing, subsequent encounter 10/03/2013     Referrals to Alternative Service(s): Referred to Alternative Service(s):   Place:   Date:   Time:    Referred to Alternative Service(s):   Place:   Date:   Time:    Referred to Alternative Service(s):   Place:   Date:   Time:    Referred to Alternative Service(s):   Place:   Date:   Time:     Ralph Dowdy, LMFT

## 2021-02-05 MED ORDER — QUETIAPINE FUMARATE 50 MG PO TABS
25.0000 mg | ORAL_TABLET | Freq: Two times a day (BID) | ORAL | Status: DC
  Administered 2021-02-05: 25 mg via ORAL
  Filled 2021-02-05: qty 1

## 2021-02-05 NOTE — ED Notes (Signed)
Pt given meal tray.

## 2021-02-05 NOTE — ED Notes (Signed)
This RN spoke to Diplomatic Services operational officer at [employer] and left call back number. No one available to speak with this RN at this time.

## 2021-02-05 NOTE — ED Notes (Signed)
Report given Wallace Cullens, RN at [facility].

## 2021-02-05 NOTE — ED Notes (Addendum)
Patient is calm and cooperative. Pt takes medications without issues. Pt allows for vital sign check without issues. Patient ate breakfast and lunch. Patient ambulatory without assistance to the restroom. Allergies noted in chart. No concerns from this RN at this time. Sitter at bedside.  IVC orange folder in

## 2021-02-05 NOTE — Progress Notes (Addendum)
Inpatient Behavioral Health Placement  Pt meets inpatient criteria per Melbourne Abts, PA-C.  Per [facility] Fransico Michael, RN there are no appropriate beds. Referral was sent to the following out of network facilities:   [facility] Provider Address Phone Fax  [facility]  [address], [place] [address] [phone]  [facility]  [address] [place] [address] [phone]  [facility] [address]  [address], [place] [address] [phone]  [facility]  [address] [place], [place] [address] [phone]  [facility]  [address] [place] [address] [phone]  [facility]  [address] [place] [address] [phone]  [facility]  [address] [place] [address] [phone]  [facility]  [address], [place] [address] [phone]  [facility]  [address] [place] [address] [phone]  [facility]  [address], [place] [address] [phone]  [facility]  [address] [place] [address] [phone]  [facility]  [address] [place] [address] [phone]  [facility]  [address] [place] [address] [phone]  [facility]  [address], [place] [address] [phone]   Situation ongoing,  CSW will follow up.   Maryjean Ka, MSW, LCSWA 02/05/2021  @ 12:29 AM;

## 2021-02-05 NOTE — ED Notes (Signed)
Gave report to [place] at [facility]. She will need to call bed placement back to confirm but they will be able to take him today.

## 2021-02-05 NOTE — ED Notes (Signed)
Per Elijah Birk, "[facility] is considering the patient/ They have a few questions for the RN. RN to call them at [phone]."   This RN attempted to call [facility] at the number provided. No answer. Will attempt to call back later.

## 2021-02-05 NOTE — BH Assessment (Addendum)
[facility] Assessment Progress Note   Per Dorena Bodo, NP, this pt requires psychiatric hospitalization at this time.  Pt presents under IVC initiated by EDP Marianna Fuss, MD.  At 14:39 Jamesetta So calls from [facility] to report that pt has been accepted to their facility by Dr Forrestine Him to the [facility] C unit.  EDP Bethann Berkshire, MD and pt's nurse, Whitney Post, have been notified, and Whitney Post agrees to call report to [phone].  Pt is to be transported via [facility].  Doylene Canning [facility] Coordinator [phone]

## 2021-02-05 NOTE — ED Provider Notes (Signed)
Emergency Medicine Observation Re-evaluation Note  Christopher Zamora is a 30 y.o. male, seen on rounds today.  Pt initially presented to the ED for complaints of Suicidal   Physical Exam  BP 128/77 (BP Location: Left Arm)   Pulse 89   Temp 97.8 F (36.6 C) (Oral)   Resp 16   SpO2 100%  Physical Exam Aleep and stable EKG:   I have reviewed the labs performed to date as well as medications administered while in observation.    Plan  Current plan is for placement  Christopher Zamora is not under involuntary commitment.     Bethann Berkshire, MD 02/05/21 [phone]

## 2021-02-05 NOTE — ED Notes (Signed)
Attempted to call pt's father to update him regarding pt's status, per pt's request. Father, Kayhan Boardley, did not answer at this time

## 2021-02-05 NOTE — ED Notes (Signed)
[facility] for transport. ETA- " not long at all".

## 2021-02-05 NOTE — Discharge Instructions (Addendum)
Transfer to old [place] Dr. Forrestine Him excepting

## 2021-02-06 ENCOUNTER — Telehealth: Payer: Self-pay

## 2021-02-06 NOTE — Telephone Encounter (Signed)
Transition Care Management Unsuccessful Follow-up Telephone Call  Date of discharge and from where:  02/04/2021 from [facility] ED  Attempts:  1st Attempt  Reason for unsuccessful TCM follow-up call:  Left voice message

## 2021-02-07 NOTE — Telephone Encounter (Signed)
Transition Care Management Unsuccessful Follow-up Telephone Call  Date of discharge and from where:  02/04/21 from [facility] ED  Attempts:  2nd Attempt  Reason for unsuccessful TCM follow-up call:  Left voice message

## 2021-02-10 NOTE — Telephone Encounter (Signed)
Transition Care Management Unsuccessful Follow-up Telephone Call  Date of discharge and from where:  02/04/2021 from [facility] ED  Attempts:  3rd Attempt  Reason for unsuccessful TCM follow-up call:  Unable to reach patient

## 2021-11-15 IMAGING — CT CT HEAD W/O CM
3 series · 16 of 47 positions shown, 19 images · non-contrast
Comparison: CT head 07/03/2002 report without imaging

CLINICAL DATA: Head trauma

EXAM:
CT HEAD WITHOUT CONTRAST
TECHNIQUE: Contiguous axial images were obtained from the base of the skull
through the vertex without intravenous contrast.

[Series 3: head wo · axial · 0.47mm/px · z∈[-161,-26]mm · 10 of 33 slices shown, 13 images]
[im 3/33  brain]
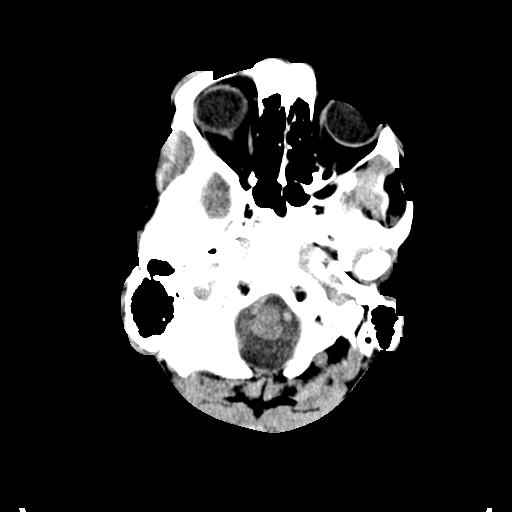
[im 3/33  bone]
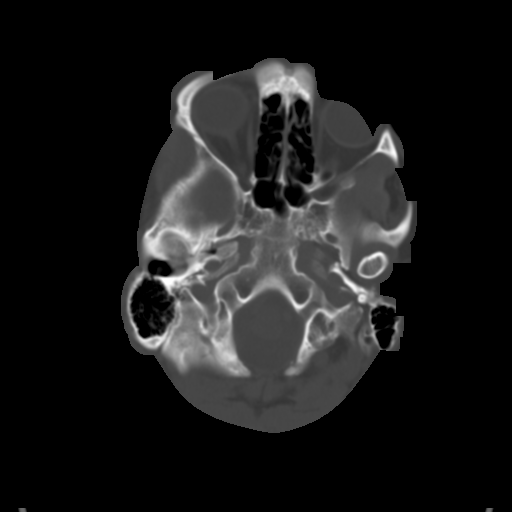
[im 6/33  brain]
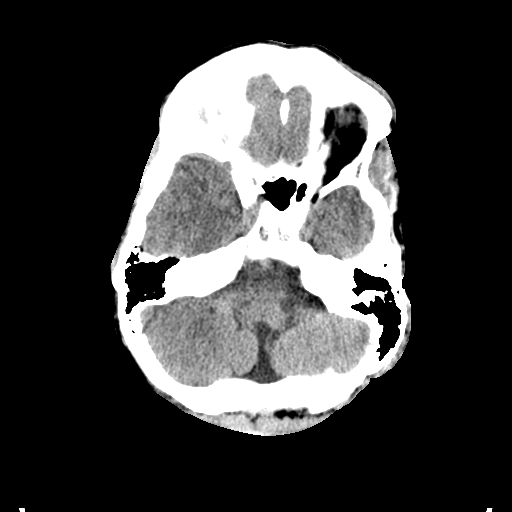
[im 9/33  brain]
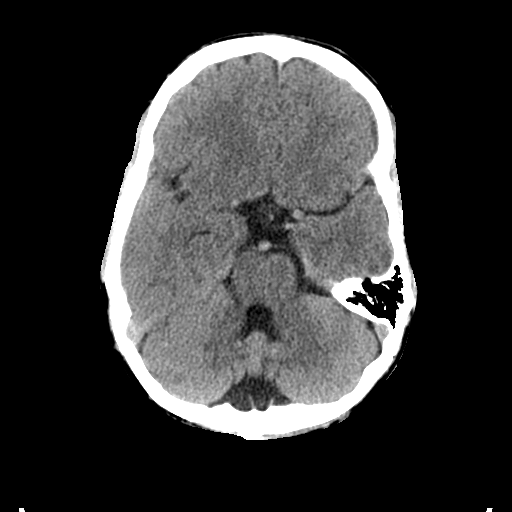
[im 12/33  brain]
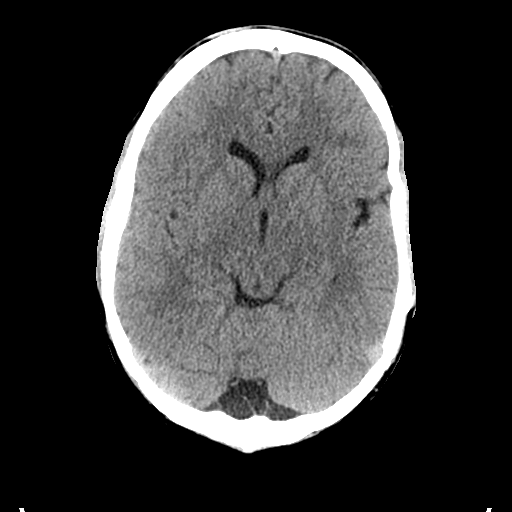
[im 15/33  brain]
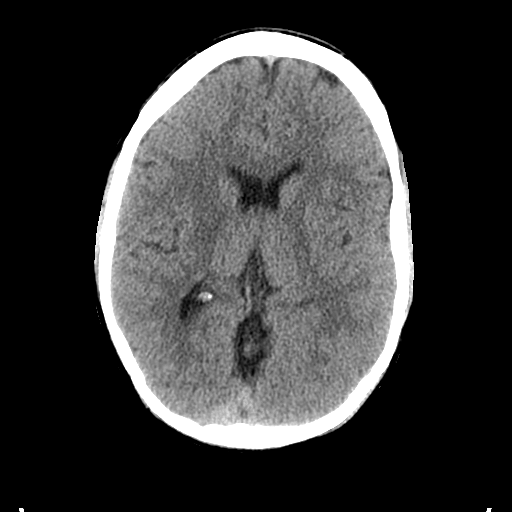
[im 15/33  bone]
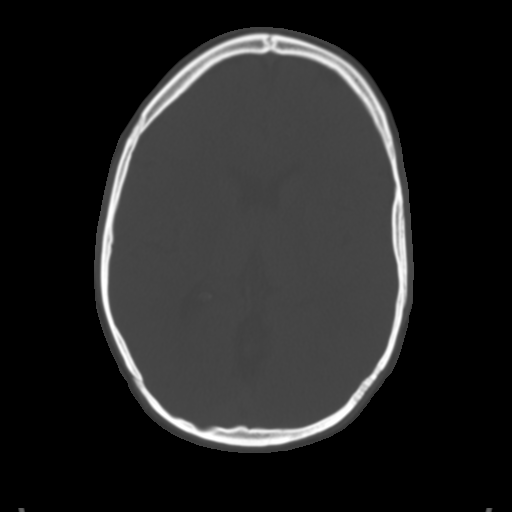
[im 18/33  brain]
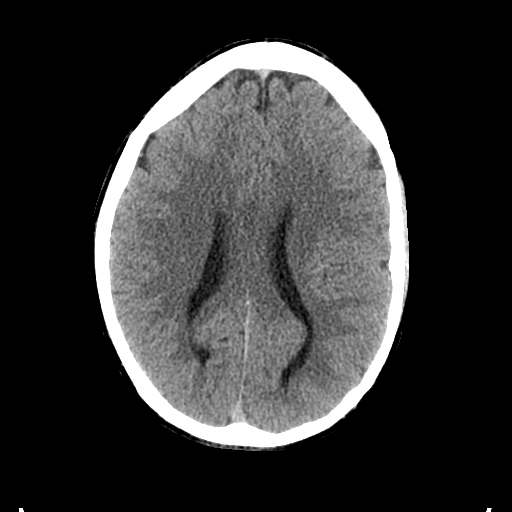
[im 21/33  brain]
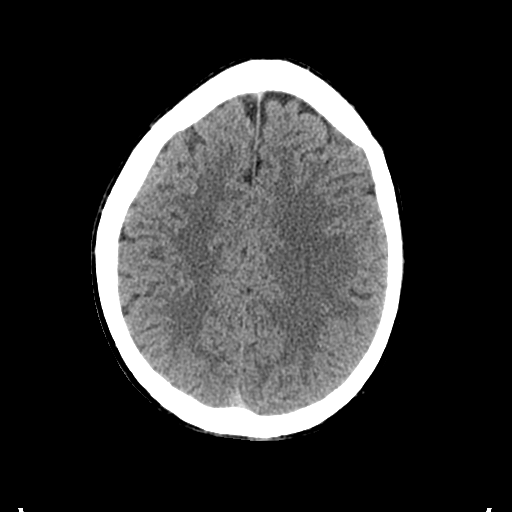
[im 25/33  brain]
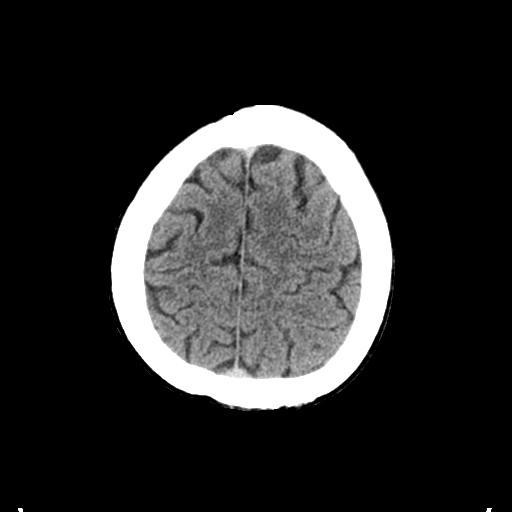
[im 27/33  brain]
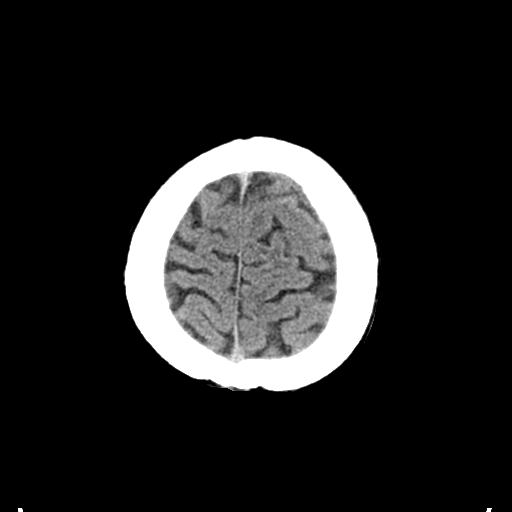
[im 27/33  bone]
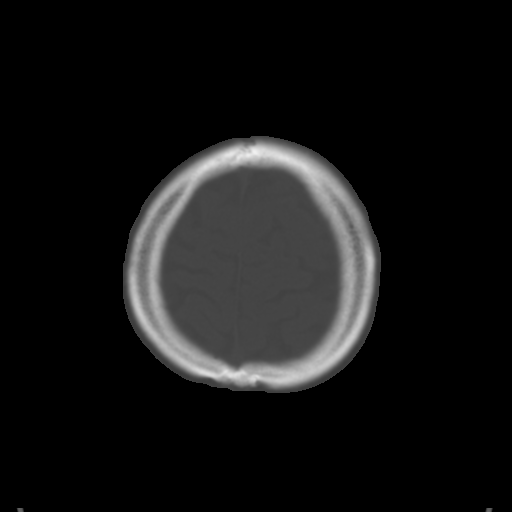
[im 30/33  brain]
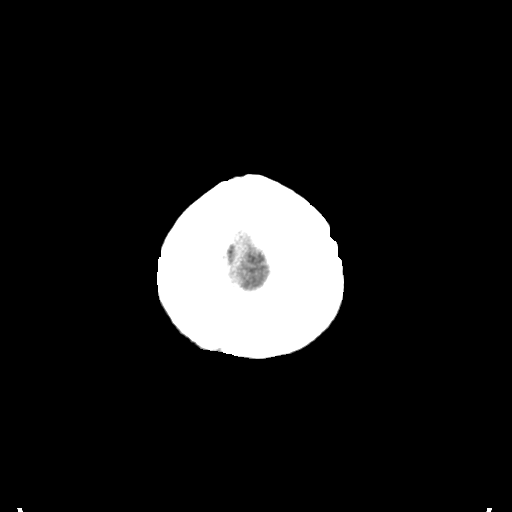

[Series 5: coronal soft tissue · coronal · 0.33mm/px · 3 of 84 slices shown]
[im 28/84  brain]
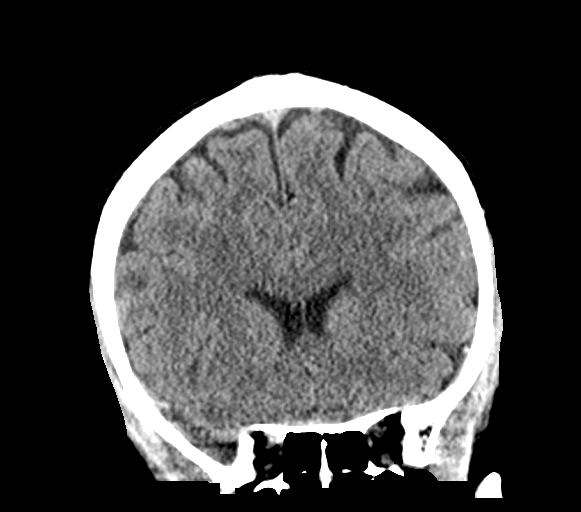
[im 37/84  brain]
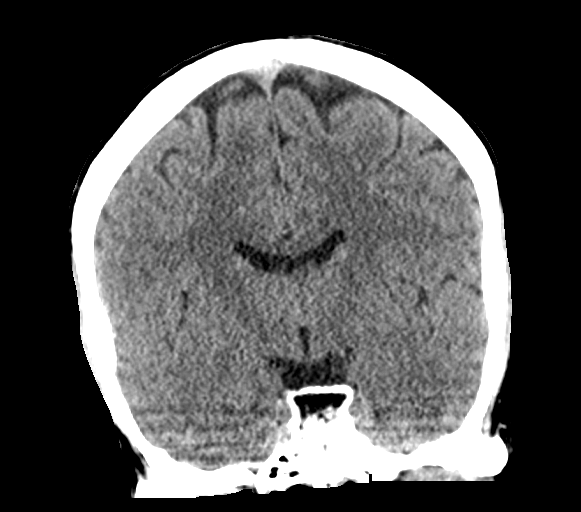
[im 47/84  brain]
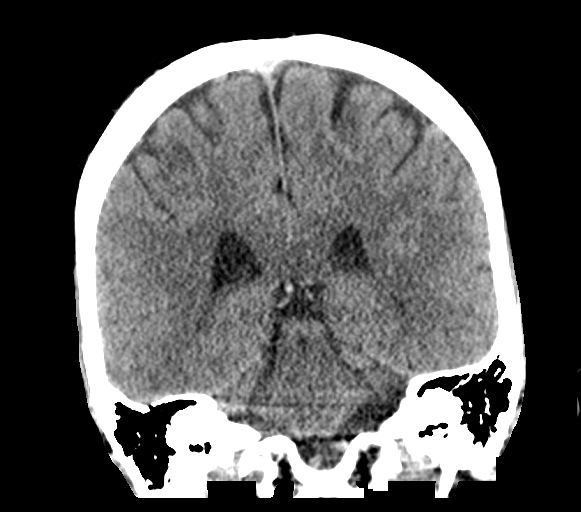

[Series 6: sagittal soft tissue · sagittal · 0.33mm/px · 3 of 63 slices shown]
[im 21/63  brain]
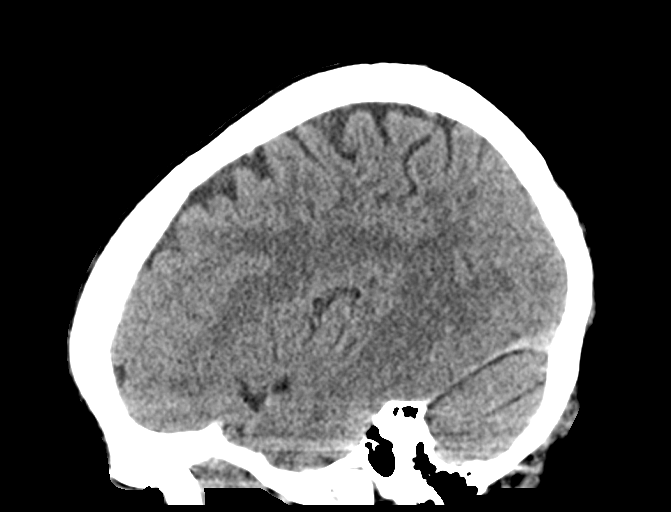
[im 32/63  brain]
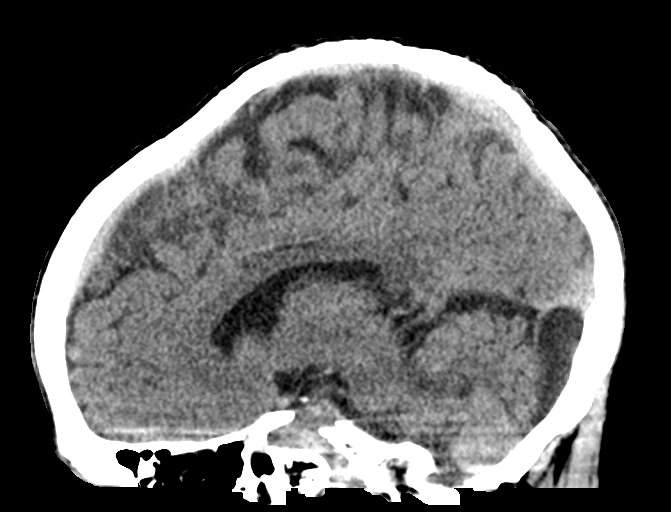
[im 42/63  brain]
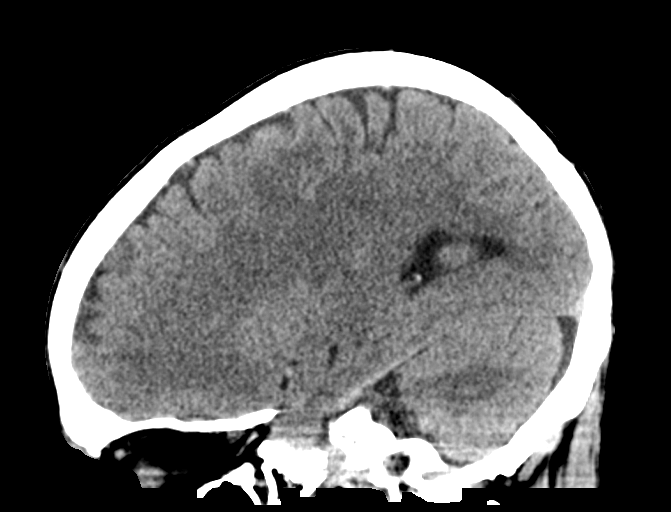

[16 of 47 positions shown; findings below may reference images not displayed]

FINDINGS: Brain:

No evidence of large-territorial acute infarction. No parenchymal
hemorrhage. No mass lesion. No extra-axial collection.

No mass effect or midline shift. No hydrocephalus. Mega Cisterna
magna again noted. Basilar cisterns are patent.

Vascular: No hyperdense vessel.

Skull: No acute fracture or focal lesion.

Sinuses/Orbits: Paranasal sinuses and mastoid air cells are clear.
The orbits are unremarkable.

Other: None.
IMPRESSION: No acute intracranial abnormality.

## 2023-05-19 ENCOUNTER — Ambulatory Visit (HOSPITAL_COMMUNITY)
Admission: EM | Admit: 2023-05-19 | Discharge: 2023-05-20 | Payer: No Payment, Other | Attending: Nurse Practitioner | Admitting: Nurse Practitioner

## 2023-05-19 DIAGNOSIS — R4182 Altered mental status, unspecified: Secondary | ICD-10-CM | POA: Insufficient documentation

## 2023-05-19 DIAGNOSIS — X58XXXA Exposure to other specified factors, initial encounter: Secondary | ICD-10-CM | POA: Insufficient documentation

## 2023-05-19 DIAGNOSIS — R4589 Other symptoms and signs involving emotional state: Secondary | ICD-10-CM | POA: Insufficient documentation

## 2023-05-19 DIAGNOSIS — S060XAA Concussion with loss of consciousness status unknown, initial encounter: Secondary | ICD-10-CM | POA: Insufficient documentation

## 2023-05-19 DIAGNOSIS — F29 Unspecified psychosis not due to a substance or known physiological condition: Secondary | ICD-10-CM

## 2023-05-19 DIAGNOSIS — F22 Delusional disorders: Secondary | ICD-10-CM | POA: Insufficient documentation

## 2023-05-19 LAB — POCT URINE DRUG SCREEN - MANUAL ENTRY (I-SCREEN)
POC Amphetamine UR: NOT DETECTED
POC Buprenorphine (BUP): NOT DETECTED
POC Cocaine UR: NOT DETECTED
POC Marijuana UR: NOT DETECTED
POC Methadone UR: NOT DETECTED
POC Methamphetamine UR: NOT DETECTED
POC Morphine: NOT DETECTED
POC Oxazepam (BZO): NOT DETECTED
POC Oxycodone UR: NOT DETECTED
POC Secobarbital (BAR): NOT DETECTED

## 2023-05-19 MED ORDER — LORAZEPAM 2 MG/ML IJ SOLN: 1.0000 mg | Freq: Two times a day (BID) | INTRAMUSCULAR | Status: DC | PRN

## 2023-05-19 MED ORDER — ALUM & MAG HYDROXIDE-SIMETH 200-200-20 MG/5ML PO SUSP: 30.0000 mL | ORAL | Status: DC | PRN

## 2023-05-19 MED ORDER — TRAZODONE HCL 50 MG PO TABS: 50.0000 mg | ORAL_TABLET | Freq: Every evening | ORAL | Status: DC | PRN

## 2023-05-19 MED ORDER — LORAZEPAM 1 MG PO TABS
1.0000 mg | ORAL_TABLET | Freq: Two times a day (BID) | ORAL | Status: DC | PRN
  Filled 2023-05-19: qty 1

## 2023-05-19 MED ORDER — ACETAMINOPHEN 325 MG PO TABS: 650.0000 mg | ORAL_TABLET | Freq: Four times a day (QID) | ORAL | Status: DC | PRN

## 2023-05-19 MED ORDER — MAGNESIUM HYDROXIDE 400 MG/5ML PO SUSP: 30.0000 mL | Freq: Every day | ORAL | Status: DC | PRN

## 2023-05-19 MED ORDER — ZIPRASIDONE MESYLATE 20 MG IM SOLR: 20.0000 mg | Freq: Two times a day (BID) | INTRAMUSCULAR | Status: DC | PRN

## 2023-05-19 MED ORDER — QUETIAPINE FUMARATE 25 MG PO TABS
25.0000 mg | ORAL_TABLET | Freq: Every day | ORAL | Status: DC
  Filled 2023-05-19: qty 1

## 2023-05-19 MED ORDER — HYDROXYZINE HCL 25 MG PO TABS: 25.0000 mg | ORAL_TABLET | Freq: Three times a day (TID) | ORAL | Status: DC | PRN

## 2023-05-19 MED ORDER — OLANZAPINE 5 MG PO TBDP: 5.0000 mg | ORAL_TABLET | Freq: Every day | ORAL | Status: DC
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
## 2023-05-19 NOTE — Progress Notes (Signed)
   05/19/23 2242  [facility] Triage Screening (Walk-ins at [facility] only)  How Did You Hear About Us ? Legal System  What Is the Reason for Your Visit/Call Today? Pt presents to [facility] voluntarily, accompanied by GPD due to bizarre behaviors and hallucinations. Per GPD, pt was at his family home and believed that spirits and people were controlling him. Pt family retreated to the neighbors home and called 911 for pt to be evaluated. Pt has history of Schizophrenia and Bipolar and according to family pt is not compliant with medication regimen. Pt reports feeling increased pressure in his head and appears to be responding to internal stimuli as he mouths words softly to himself.  Pt stated that he wants the pain to end, when asked if he was having thoughts of wanting to harm/hurt himself. Pt was unable to elaborate specifically what he meant. Pt denies plan/intent. Pt currently denies HI,VH substance/alcohol use.  How Long Has This Been Causing You Problems? <Week  Have You Recently Had Any Thoughts About Hurting Yourself? No  Are You Planning to Commit Suicide/Harm Yourself At This time? No  Have you Recently Had Thoughts About Hurting Someone Marigene Shoulder? No  Are You Planning To Harm Someone At This Time? No  Physical Abuse Denies  Verbal Abuse Denies  Sexual Abuse Denies  Exploitation of patient/patient's resources Denies  Self-Neglect Denies  Are you currently experiencing any auditory, visual or other hallucinations?  (Pt denies, but appears to be responding to internal stimuli)  Have You Used Any Alcohol or Drugs in the Past 24 Hours? No  Do you have any current medical co-morbidities that require immediate attention? No  Clinician description of patient physical appearance/behavior: pt is calm, cooperative, hyperreligious. Casually dressed  What Do You Feel Would Help You the Most Today? Treatment for Depression or other mood problem;Medication(s);Social Support  If access to [facility] was not available,  would you have sought care in the Emergency Department? Yes  Determination of Need Urgent (48 hours)  Options For Referral Other: Comment;Outpatient Therapy;Inpatient Hospitalization;Medication Management  Determination of Need filed? Yes
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
## 2023-05-20 DIAGNOSIS — R4589 Other symptoms and signs involving emotional state: Secondary | ICD-10-CM | POA: Diagnosis not present

## 2023-05-20 DIAGNOSIS — R4182 Altered mental status, unspecified: Secondary | ICD-10-CM | POA: Diagnosis not present

## 2023-05-20 DIAGNOSIS — F22 Delusional disorders: Secondary | ICD-10-CM | POA: Diagnosis not present

## 2023-05-20 DIAGNOSIS — S060XAA Concussion with loss of consciousness status unknown, initial encounter: Secondary | ICD-10-CM | POA: Diagnosis not present

## 2023-05-20 LAB — CBC WITH DIFFERENTIAL/PLATELET
Abs Immature Granulocytes: 0.02 10*3/uL (ref 0.00–0.07)
Basophils Absolute: 0 10*3/uL (ref 0.0–0.1)
Basophils Relative: 1 %
Eosinophils Absolute: 0.1 10*3/uL (ref 0.0–0.5)
Eosinophils Relative: 1 %
HCT: 41.6 % (ref 39.0–52.0)
Hemoglobin: 14.3 g/dL (ref 13.0–17.0)
Immature Granulocytes: 0 %
Lymphocytes Relative: 20 %
Lymphs Abs: 1.3 10*3/uL (ref 0.7–4.0)
MCH: 30.4 pg (ref 26.0–34.0)
MCHC: 34.4 g/dL (ref 30.0–36.0)
MCV: 88.5 fL (ref 80.0–100.0)
Monocytes Absolute: 0.4 10*3/uL (ref 0.1–1.0)
Monocytes Relative: 7 %
Neutro Abs: 4.6 10*3/uL (ref 1.7–7.7)
Neutrophils Relative %: 71 %
Platelets: 183 10*3/uL (ref 150–400)
RBC: 4.7 MIL/uL (ref 4.22–5.81)
RDW: 11.7 % (ref 11.5–15.5)
WBC: 6.5 10*3/uL (ref 4.0–10.5)
nRBC: 0 % (ref 0.0–0.2)

## 2023-05-20 LAB — COMPREHENSIVE METABOLIC PANEL
ALT: 23 U/L (ref 0–44)
AST: 29 U/L (ref 15–41)
Albumin: 4.8 g/dL (ref 3.5–5.0)
Alkaline Phosphatase: 54 U/L (ref 38–126)
Anion gap: 7 (ref 5–15)
BUN: 16 mg/dL (ref 6–20)
CO2: 29 mmol/L (ref 22–32)
Calcium: 9.8 mg/dL (ref 8.9–10.3)
Chloride: 100 mmol/L (ref 98–111)
Creatinine, Ser: 0.88 mg/dL (ref 0.61–1.24)
GFR, Estimated: >60 mL/min (ref >60)
Glucose, Bld: 95 mg/dL (ref 70–99)
Potassium: 4 mmol/L (ref 3.5–5.1)
Sodium: 136 mmol/L (ref 135–145)
Total Bilirubin: 1.3 mg/dL — ABNORMAL HIGH (ref 0.0–1.2)
Total Protein: 6.8 g/dL (ref 6.5–8.1)

## 2023-05-20 LAB — TSH: TSH: 1.555 u[IU]/mL (ref 0.350–4.500)

## 2023-05-20 LAB — LIPID PANEL
Cholesterol: 147 mg/dL (ref 0–200)
HDL: 42 mg/dL (ref >40)
LDL Cholesterol: 87 mg/dL (ref 0–99)
Total CHOL/HDL Ratio: 3.5 {ratio}
Triglycerides: 91 mg/dL (ref <150)
VLDL: 18 mg/dL (ref 0–40)

## 2023-05-20 LAB — HEMOGLOBIN A1C
Hgb A1c MFr Bld: 4.6 % — ABNORMAL LOW (ref 4.8–5.6)
Mean Plasma Glucose: 85.32 mg/dL

## 2023-05-20 MED ORDER — NICOTINE POLACRILEX 2 MG MT GUM
CHEWING_GUM | OROMUCOSAL | Status: AC
  Filled 2023-05-20: qty 1

## 2023-05-20 NOTE — Discharge Instructions (Signed)
Pt was accepted to [facility] TODAY 05/20/2023; Bed Assignment Main Campus

## 2023-05-20 NOTE — ED Notes (Signed)
Sheriff has been called for transport to [facility]

## 2023-05-20 NOTE — ED Provider Notes (Signed)
FBC/OBS ASAP Discharge Summary  Date and Time: 05/20/2023 9:12 AM  Name: Christopher Zamora  MRN:  [id number]   Discharge Diagnoses:  Final diagnoses:  Psychosis, unspecified psychosis type (HCC)  Delusions (HCC)    Subjective: Patient seen ane evaluated face to face by this provider prior to transfer to [facility] this morning. Patient alert and oriented x 4. He does not appear to be in acute distress. He denies SI/HI/AVH. There is no objective evidence that the patient is currently responding to internal or external stimuli. Patient denies physical complaints. Patient denies medication side effects. Patient verbalizes understanding that he's under IVC and will be transferred to [facility] this morning for inpatient psychiatric treatment.   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 Patient is under IVC.    Per IVC petition "The respondent is not eating or sleeping. The respondent states that he has a chip that is controlling him and he is chasing his mom with a knife. The respondent thinks that he is [place] in the Bible and has a robotic dog. The respondent also held a knife to his throat and the side of his chest saying he wants to end his life. The respondent acted in a way that created a substantial risk of serious bodily harm and there is a reasonable probability that this conduct will be repeated".  Patient was started on Seroquel 25 mg po at bedtime for mood stabilization. Labs unremarkable. UDS negative.    Total Time spent with patient: 30 minutes  Past Psychiatric History: psychiatric history of manic episodes, suicidal ideations and attempts,  psychosis/altered mental status, hallucinations, delusions, and panic attacks,  Past Medical History: medical history significant for about 10 concussions/TBI,  Family History: No history reported.  Family Psychiatric History: No history reported.   Social History: Patient reports "I was living with family, but I just moved back from [place] a week or 2 ago with my dog. Patient denies access to a gun. Patient denies drinking alcohol or using illicit drugs.   Tobacco Cessation:  N/A, patient does not currently use tobacco products  Current Medications:  Current Facility-Administered Medications  Medication Dose Route Frequency Provider Last Rate Last Admin   nicotine polacrilex (NICORETTE) 2 MG gum            acetaminophen (TYLENOL) tablet 650 mg  650 mg Oral Q6H PRN Onuoha, Chinwendu V, NP       alum & mag hydroxide-simeth (MAALOX/MYLANTA) 200-200-20 MG/5ML suspension 30 mL  30 mL Oral Q4H PRN Onuoha, Chinwendu V, NP       hydrOXYzine (ATARAX) tablet 25 mg  25 mg Oral TID PRN Onuoha, Chinwendu V, NP       LORazepam (ATIVAN) tablet 1 mg  1 mg Oral BID PRN Onuoha, Chinwendu V, NP       Or   LORazepam (ATIVAN) injection 1 mg  1 mg Intramuscular BID PRN Onuoha, Chinwendu V, NP       magnesium hydroxide (MILK OF MAGNESIA) suspension 30 mL  30 mL Oral Daily PRN Onuoha, Chinwendu V, NP       QUEtiapine (SEROQUEL) tablet 25 mg  25 mg Oral QHS Onuoha, Chinwendu V, NP       traZODone (DESYREL) tablet  50 mg  50 mg Oral QHS PRN Onuoha, Chinwendu V, NP       ziprasidone (GEODON) injection 20 mg  20 mg Intramuscular Q12H PRN Onuoha, Chinwendu V, NP       No current outpatient medications on file.    PTA Medications:  Facility Ordered Medications  Medication   nicotine polacrilex (NICORETTE) 2 MG gum   acetaminophen (TYLENOL) tablet 650 mg   alum & mag hydroxide-simeth (MAALOX/MYLANTA) 200-200-20 MG/5ML suspension 30 mL   magnesium hydroxide (MILK OF MAGNESIA) suspension 30 mL   hydrOXYzine  (ATARAX) tablet 25 mg   traZODone (DESYREL) tablet 50 mg   LORazepam (ATIVAN) tablet 1 mg   Or   LORazepam (ATIVAN) injection 1 mg   ziprasidone (GEODON) injection 20 mg   QUEtiapine (SEROQUEL) tablet 25 mg       05/15/2017    5:58 AM  Depression screen PHQ 2/9  Decreased Interest 0  Down, Depressed, Hopeless 0  PHQ - 2 Score 0    Flowsheet Row ED from 05/19/2023 in [facility] ED from 02/04/2021 in [facility] Emergency Department at [facility]  C-SSRS RISK CATEGORY High Risk High Risk       Musculoskeletal  Strength & Muscle Tone: within normal limits Gait & Station: normal Patient leans: N/A  Psychiatric Specialty Exam  Presentation  General Appearance:  Appropriate for Environment  Eye Contact: Fair  Speech: Slow  Speech Volume: Normal  Handedness: Right   Mood and Affect  Mood: Anxious  Affect: Congruent   Thought Process  Thought Processes: Linear  Descriptions of Associations:Intact  Orientation:Full (Time, Place and Person)  Thought Content:Delusions  Diagnosis of Schizophrenia or Schizoaffective disorder in past: Yes  Duration of Psychotic Symptoms: Less than six months   Hallucinations:None.  Ideas of Reference:Delusions  Suicidal Thoughts:No  Homicidal Thoughts:Homicidal Thoughts: No   Sensorium  Memory: Immediate Fair  Judgment: Intact  Insight: Present   Executive Functions  Concentration: Fair  Attention Span: Fair  Recall: [employer] of Knowledge: Fair  Language: Fair   Psychomotor Activity  Psychomotor Activity: Psychomotor Activity: Normal   Assets  Assets: Communication Skills; Desire for Improvement; Housing; Social Support   Sleep  Sleep: Sleep: Fair   Nutritional Assessment (For OBS and FBC admissions only) Has the patient had a weight loss or gain of 10 pounds or more in the last 3 months?: Yes Has the patient had a decrease in food  intake/or appetite?: Yes Does the patient have dental problems?: No Does the patient have eating habits or behaviors that may be indicators of an eating disorder including binging or inducing vomiting?: No Has the patient recently lost weight without trying?: 2.0 Has the patient been eating poorly because of a decreased appetite?: 1 Malnutrition Screening Tool Score: 3 Nutritional Assessment Referrals: Medication/Tx changes    Physical Exam  Physical Exam Cardiovascular:     Rate and Rhythm: Normal rate.  Pulmonary:     Effort: Pulmonary effort is normal.  Musculoskeletal:     Cervical back: Normal range of motion.  Neurological:     Mental Status: He is alert and oriented to person, place, and time.    Review of Systems  HENT: Negative.    Eyes: Negative.   Respiratory: Negative.    Cardiovascular: Negative.   Gastrointestinal: Negative.   Genitourinary: Negative.   Musculoskeletal: Negative.   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  Blood pressure 132/88, pulse 86, temperature 98.3 F (36.8 C), temperature source Oral, resp. rate 18, SpO2 100%. There is no height or weight on file to calculate BMI.  Plan Of Care/Follow-up recommendations:  Patient is recommended for inpatient psychiatric treatment for SI, delusional thought content and mood stabilizations. Patient is under IVC. IVC First Exam competed by this provider.   Disposition: [facility] 05[facility]20/2023; Bed Assignment Main Campus. Attending Physician will be Dr. Lorrin Goodell, Chrystine Oiler, NP 05/20/2023, 9:12 AM

## 2023-05-20 NOTE — ED Notes (Signed)
Patient has been brought on the unit, familiarized with unit and is now lying in bed resting with eyes closed in no acute distress.

## 2023-05-20 NOTE — Progress Notes (Signed)
Report called to Pervis Hocking RN at [facility]

## 2023-05-20 NOTE — ED Notes (Signed)
Patient d/c in stable condition with all belongings to [facility] via [employer] transport. Patient denies SI, HI, AVH. Patient does not appear to be responding to internal stimuli.

## 2023-05-20 NOTE — ED Notes (Signed)
Pt is currently sleeping, no distress noted, environmental check complete, will continue to monitor patient for safety.  

## 2023-05-20 NOTE — Progress Notes (Signed)
Pt was accepted to [facility] TODAY 05/20/2023; Bed Assignment Main [facility] Fax Number: [phone] (Adult)   Pt meets inpatient criteria per Mancel Bale, NP      Attending Physician will be Dr. Loni Beckwith   Report can be called to:[phone]-Pager number, please leave a returned phone number to receive a phone call back.   Pt can arrive after 9:00am   Care Team notified: Homero Fellers [place],LCSWA   Maryjean Ka, MSW, [facility] 05/20/2023 2:52 AM

## 2023-05-20 NOTE — Progress Notes (Signed)
Inpatient Behavioral Health Placement   Pt meets inpatient behavioral health per Mancel Bale, NP. CSW requested [facility] AC Kenisha Herbin,RN to review.   Maryjean Ka, MSW, LCSWA 05/20/2023 1:58 AM

## 2023-05-20 NOTE — ED Notes (Signed)
Patient received breakfast. 

## 2023-05-20 NOTE — Progress Notes (Signed)
LCSW Progress Note  [id number]   Christopher Zamora  05/20/2023  2:15 AM    Inpatient Behavioral Health Placement  Pt meets inpatient criteria per Mancel Bale, NP. There are no available beds within [facility] per [facility] Christopher Herbin,RN. Referral was sent to the following facilities;   Destination  Service Provider Address Phone Fax  [facility] [address] [place], [place] [address] [phone]  [facility] [address] [place] [address] [phone]  [facility] [address] [place], [place] [address] [phone]  [facility] [address], [place] [address] [phone]  [facility] [address] [place] [address] [phone]  [facility] [address] [place], [place] [address] [phone]  [facility] [address] [place] [address] [phone]  [facility] [address] [place] [address] [phone]  [facility] [address], [place] [address] [phone]  [facility] [address], [place] [address] [phone]  [facility] [address], [place] [address] [phone]  [facility] [address] [place] [address] [phone]  [facility] [place] [phone]  [facility] EFAX [address] [place], [place] [phone]  [facility] [address], [place] [address] [phone]  [facility] [address] [place] [address] [phone]   [facility] [address] [place] [address] [phone]  [facility] [address], [place] [address] [phone]  [facility] [address], [place] [address] [phone]  [facility] [address] [place], [place] [address] [phone]  [facility] [address] [place] [address] [phone]  [facility] [place] [phone]  [facility] [address], [place] [address] [phone]  [facility] [address], [place] [address] [phone]  [facility] Patient Placement [facility], [place] [phone]  [facility] [address] [place] [address] [phone]    Situation ongoing,  CSW will follow up.    Maryjean Ka, MSW, LCSWA 05/20/2023 2:15 AM
